# Patient Record
Sex: Male | Born: 1995
Health system: Southern US, Community
[De-identification: ages and names within clinical notes are randomized; demographics above are authoritative.]

## PROBLEM LIST (undated history)

## (undated) DIAGNOSIS — Z8719 Personal history of other diseases of the digestive system: Secondary | ICD-10-CM

## (undated) DIAGNOSIS — Z9889 Other specified postprocedural states: Secondary | ICD-10-CM

## (undated) DIAGNOSIS — U071 COVID-19: Secondary | ICD-10-CM

## (undated) DIAGNOSIS — J209 Acute bronchitis, unspecified: Secondary | ICD-10-CM

## (undated) DIAGNOSIS — R21 Rash and other nonspecific skin eruption: Secondary | ICD-10-CM

## (undated) DIAGNOSIS — L97909 Non-pressure chronic ulcer of unspecified part of unspecified lower leg with unspecified severity: Secondary | ICD-10-CM

## (undated) DIAGNOSIS — R6521 Severe sepsis with septic shock: Secondary | ICD-10-CM

## (undated) DIAGNOSIS — Z87442 Personal history of urinary calculi: Secondary | ICD-10-CM

## (undated) DIAGNOSIS — Z931 Gastrostomy status: Secondary | ICD-10-CM

## (undated) DIAGNOSIS — I872 Venous insufficiency (chronic) (peripheral): Secondary | ICD-10-CM

## (undated) DIAGNOSIS — Z8659 Personal history of other mental and behavioral disorders: Secondary | ICD-10-CM

## (undated) DIAGNOSIS — Z8674 Personal history of sudden cardiac arrest: Secondary | ICD-10-CM

## (undated) DIAGNOSIS — A409 Streptococcal sepsis, unspecified: Secondary | ICD-10-CM

## (undated) DIAGNOSIS — Z8709 Personal history of other diseases of the respiratory system: Secondary | ICD-10-CM

## (undated) HISTORY — DX: Rash and other nonspecific skin eruption: R21

## (undated) HISTORY — DX: Venous insufficiency (chronic) (peripheral): I87.2

## (undated) HISTORY — DX: Acute bronchitis, unspecified: J20.9

## (undated) HISTORY — DX: Non-pressure chronic ulcer of unspecified part of unspecified lower leg with unspecified severity: L97.909

## (undated) HISTORY — PX: LATERAL RECTUS RESECTION: SHX2724

## (undated) HISTORY — DX: Severe sepsis with septic shock: R65.21

## (undated) HISTORY — DX: Streptococcal sepsis, unspecified: A40.9

## (undated) HISTORY — DX: Personal history of other mental and behavioral disorders: Z86.59

## (undated) HISTORY — DX: Personal history of other diseases of the digestive system: Z87.19

## (undated) HISTORY — DX: Personal history of sudden cardiac arrest: Z86.74

---
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
## 2014-06-26 ENCOUNTER — Telehealth: Payer: Self-pay | Admitting: Family Medicine

## 2014-06-26 NOTE — Telephone Encounter (Signed)
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

## 2014-06-26 NOTE — Telephone Encounter (Signed)
Please give a prescription with what the mom requests an order for the patient to get what he needs , I will sign

## 2014-06-26 NOTE — Telephone Encounter (Signed)
Script ready for pickup. Mother notified.  

## 2016-01-20 ENCOUNTER — Encounter (HOSPITAL_COMMUNITY): Payer: Self-pay | Admitting: Emergency Medicine

## 2016-01-20 ENCOUNTER — Emergency Department (HOSPITAL_COMMUNITY): Payer: BLUE CROSS/BLUE SHIELD

## 2016-01-20 ENCOUNTER — Emergency Department (HOSPITAL_COMMUNITY)
Admission: EM | Admit: 2016-01-20 | Discharge: 2016-01-20 | Disposition: A | Discharge status: Home / Self Care | Payer: BLUE CROSS/BLUE SHIELD | Attending: Emergency Medicine | Admitting: Emergency Medicine

## 2016-01-20 DIAGNOSIS — Z89511 Acquired absence of right leg below knee: Secondary | ICD-10-CM | POA: Diagnosis not present

## 2016-01-20 DIAGNOSIS — N200 Calculus of kidney: Secondary | ICD-10-CM | POA: Diagnosis not present

## 2016-01-20 DIAGNOSIS — Z8659 Personal history of other mental and behavioral disorders: Secondary | ICD-10-CM | POA: Diagnosis not present

## 2016-01-20 DIAGNOSIS — Z8719 Personal history of other diseases of the digestive system: Secondary | ICD-10-CM | POA: Diagnosis not present

## 2016-01-20 DIAGNOSIS — Z79899 Other long term (current) drug therapy: Secondary | ICD-10-CM | POA: Insufficient documentation

## 2016-01-20 DIAGNOSIS — Z8674 Personal history of sudden cardiac arrest: Secondary | ICD-10-CM | POA: Diagnosis not present

## 2016-01-20 DIAGNOSIS — Z8709 Personal history of other diseases of the respiratory system: Secondary | ICD-10-CM | POA: Insufficient documentation

## 2016-01-20 DIAGNOSIS — Z872 Personal history of diseases of the skin and subcutaneous tissue: Secondary | ICD-10-CM | POA: Diagnosis not present

## 2016-01-20 DIAGNOSIS — R109 Unspecified abdominal pain: Secondary | ICD-10-CM | POA: Diagnosis present

## 2016-01-20 DIAGNOSIS — Z8619 Personal history of other infectious and parasitic diseases: Secondary | ICD-10-CM | POA: Diagnosis not present

## 2016-01-20 DIAGNOSIS — Z792 Long term (current) use of antibiotics: Secondary | ICD-10-CM | POA: Diagnosis not present

## 2016-01-20 LAB — URINALYSIS, ROUTINE W REFLEX MICROSCOPIC
BILIRUBIN URINE: NEGATIVE
Glucose, UA: NEGATIVE mg/dL
Ketones, ur: NEGATIVE mg/dL
LEUKOCYTES UA: NEGATIVE
NITRITE: NEGATIVE
Protein, ur: NEGATIVE mg/dL
SPECIFIC GRAVITY, URINE: 1.024 (ref 1.005–1.030)
pH: 5.5 (ref 5.0–8.0)

## 2016-01-20 LAB — URINE MICROSCOPIC-ADD ON

## 2016-01-20 MED ORDER — OXYCODONE-ACETAMINOPHEN 5-325 MG PO TABS: 1.0000 | ORAL_TABLET | ORAL | Status: DC | PRN

## 2016-01-20 MED ORDER — ONDANSETRON HCL 4 MG PO TABS: 4.0000 mg | ORAL_TABLET | Freq: Four times a day (QID) | ORAL | Status: DC

## 2016-01-20 NOTE — ED Notes (Signed)
Patient with right flank/back pain.  Patient denies any pain during urination.  Patient awoke from sleep with the pain.  Patient was given two 5/325mg  of Oxycodone at 5am for the pain by mother.  Patient denies any vomiting, buy is nauseated.

## 2016-01-20 NOTE — Discharge Instructions (Signed)
Flank Pain °Flank pain refers to pain that is located on the side of the body between the upper abdomen and the back. The pain may occur over a short period of time (acute) or may be long-term or reoccurring (chronic). It may be mild or severe. Flank pain can be caused by many things. °CAUSES  °Some of the more common causes of flank pain include: °· Muscle strains.   °· Muscle spasms.   °· A disease of your spine (vertebral disk disease).   °· A lung infection (pneumonia).   °· Fluid around your lungs (pulmonary edema).   °· A kidney infection.   °· Kidney stones.   °· A very painful skin rash caused by the chickenpox virus (shingles).   °· Gallbladder disease.   °HOME CARE INSTRUCTIONS  °Home care will depend on the cause of your pain. In general, °· Rest as directed by your caregiver. °· Drink enough fluids to keep your urine clear or pale yellow. °· Only take over-the-counter or prescription medicines as directed by your caregiver. Some medicines may help relieve the pain. °· Tell your caregiver about any changes in your pain. °· Follow up with your caregiver as directed. °SEEK IMMEDIATE MEDICAL CARE IF:  °· Your pain is not controlled with medicine.   °· You have new or worsening symptoms. °· Your pain increases.   °· You have abdominal pain.   °· You have shortness of breath.   °· You have persistent nausea or vomiting.   °· You have swelling in your abdomen.   °· You feel faint or pass out.   °· You have blood in your urine. °· You have a fever or persistent symptoms for more than 2-3 days. °· You have a fever and your symptoms suddenly get worse. °MAKE SURE YOU:  °· Understand these instructions. °· Will watch your condition. °· Will get help right away if you are not doing well or get worse. °  °This information is not intended to replace advice given to you by your health care provider. Make sure you discuss any questions you have with your health care provider. °  °Document Released: 01/20/2006 Document  Revised: 08/23/2012 Document Reviewed: 07/13/2012 °Elsevier Interactive Patient Education ©2016 [employer] ° °Kidney Stones °Kidney stones (urolithiasis) are solid masses that form inside your kidneys. The intense pain is caused by the stone moving through the kidney, ureter, bladder, and urethra (urinary tract). When the stone moves, the ureter starts to spasm around the stone. The stone is usually passed in your pee (urine).  °HOME CARE °· Drink enough fluids to keep your pee clear or pale yellow. This helps to get the stone out. °· Take a 24-hour pee (urine) sample as told by your doctor. You may need to take another sample every 6-12 months. °· Strain all pee through the provided strainer. Do not pee without peeing through the strainer, not even once. If you pee the stone out, catch it in the strainer. The stone may be as small as a grain of salt. Take this to your doctor. This will help your doctor figure out what you can do to try to prevent more kidney stones. °· Only take medicine as told by your doctor. °· Make changes to your daily diet as told by your doctor. You may be told to: °¨ Limit how much salt you eat. °¨ Eat 5 or more servings of fruits and vegetables each day. °¨ Limit how much meat, poultry, fish, and eggs you eat. °· Keep all follow-up visits as told by your doctor. This is important. °·   Get follow-up X-rays as told by your doctor. °GET HELP IF: °You have pain that gets worse even if you have been taking pain medicine. °GET HELP RIGHT AWAY IF:  °· Your pain does not get better with medicine. °· You have a fever or shaking chills. °· Your pain increases and gets worse over 18 hours. °· You have new belly (abdominal) pain. °· You feel faint or pass out. °· You are unable to pee. °  °This information is not intended to replace advice given to you by your health care provider. Make sure you discuss any questions you have with your health care provider. °  °Document Released: 05/17/2008 Document  Revised: 08/20/2015 Document Reviewed: 05/02/2013 °Elsevier Interactive Patient Education ©2016 Elsevier Inc. ° °

## 2016-01-21 NOTE — ED Provider Notes (Signed)
CSN: [id number]     Arrival date & time 01/20/16  1191 History   First MD Initiated Contact with Patient 01/20/16 [phone]     Chief Complaint  Patient presents with  . Back Pain  . Flank Pain     (Consider location/radiation/quality/duration/timing/severity/associated sxs/prior Treatment) HPI   20 year old male with right flank/right back pain. Acute onset of very early this morning. We'll continue to sleep. Went to bed in his usual state of health. Describes the pain as sharp. Could not find a comfortable position. Associated with nausea. No vomiting. No urinary complaints. No fevers or chills. Patient's mother gave him 2 oxycodone with only improvement. No history of similar symptoms.   Past Medical History  Diagnosis Date  . Acute bronchitis   . Ulcer of lower limb, unspecified   . Unspecified venous (peripheral) insufficiency   . Rash and other nonspecific skin eruption   . Septic shock due to streptococcal infection (HCC) 2007  . H/O cardiac arrest   . H/O gastroesophageal reflux (GERD)   . History of ADHD    Past Surgical History  Procedure Laterality Date  . Below knee leg amputation Left 2007  . Joint replacement Right 2010    Right hip    No family history on file. Social History  Substance Use Topics  . Smoking status: Never Smoker   . Smokeless tobacco: None  . Alcohol Use: No    Review of Systems    Allergies  Review of patient's allergies indicates no known allergies.  Home Medications   Prior to Admission medications   Medication Sig Start Date End Date Taking? Authorizing Provider  cefPROZIL (CEFZIL) 500 MG tablet Take 1 tablet (500 mg total) by mouth 2 (two) times daily. 12/07/13   Babs Sciara, MD  ondansetron (ZOFRAN) 4 MG tablet Take 1 tablet (4 mg total) by mouth every 6 (six) hours. 01/20/16   Raeford Razor, MD  oxyCODONE-acetaminophen (PERCOCET/ROXICET) 5-325 MG tablet Take 1-2 tablets by mouth every 4 (four) hours as needed for severe pain.  01/20/16   Raeford Razor, MD   BP 112/63 mmHg  Pulse 67  Temp(Src) 97.3 F (36.3 C) (Oral)  Resp 18  Ht  (1.778 m)  Wt 220 lb (99.791 kg)  BMI 31.57 kg/m2  SpO2 98% Physical Exam  Constitutional: He appears well-developed and well-nourished. No distress.  HENT:  Head: Normocephalic and atraumatic.  Eyes: Conjunctivae are normal. Right eye exhibits no discharge. Left eye exhibits no discharge.  Neck: Neck supple.  Cardiovascular: Normal rate, regular rhythm and normal heart sounds.  Exam reveals no gallop and no friction rub.   No murmur heard. Pulmonary/Chest: Effort normal and breath sounds normal. No respiratory distress.  Abdominal: Soft. He exhibits no distension. There is no tenderness.  Musculoskeletal: He exhibits no edema or tenderness.  L BKA  Neurological: He is alert.  Skin: Skin is warm and dry.  Psychiatric: He has a normal mood and affect. His behavior is normal. Thought content normal.  Nursing note and vitals reviewed.   ED Course  Procedures (including critical care time) Labs Review Labs Reviewed  URINALYSIS, ROUTINE W REFLEX MICROSCOPIC (NOT AT [facility]) - Abnormal; Notable for the following:    Hgb urine dipstick LARGE (*)    All other components within normal limits  URINE MICROSCOPIC-ADD ON - Abnormal; Notable for the following:    Squamous Epithelial / LPF 0-5 (*)    Bacteria, UA RARE (*)    Casts HYALINE CASTS (*)  All other components within normal limits    Imaging Review Ct Renal Stone Study  01/20/2016  CLINICAL DATA:  20 year old male with sudden onset severe epigastric and midline abdominal pain at 0230 hours with nausea. Flank pain. Initial encounter. EXAM: CT ABDOMEN AND PELVIS WITHOUT CONTRAST TECHNIQUE: Multidetector CT imaging of the abdomen and pelvis was performed following the standard protocol without IV contrast. COMPARISON:  KUB 02/06/2006. FINDINGS: Negative lung bases.  No pericardial or pleural effusion. Right hip arthroplasty,  otherwise negative visualized osseous structures. No pelvic free fluid. Decompressed rectum. Retained stool in the sigmoid colon which is mildly redundant. Retained stool throughout the more proximal colon. Normal appendix (tracking to the midline from the cecum, series 2, image 63). Negative terminal ileum. No dilated small bowel. Negative non contrast stomach, duodenum, liver, gallbladder, spleen, pancreas and adrenal glands. No abdominal free air or free fluid. No lymphadenopathy. Negative non contrast right kidney and ureter. 2 mm left lower pole renal calculus. Otherwise negative noncontrast left kidney. Negative left ureter visualize to the left pelvic sidewall. There is streak artifact in the pelvis related to the right hip arthroplasty. There is a 4 mm calculus at or adjacent to the posterior wall of the urinary bladder best seen on series 2, image 85. The urinary bladder is decompressed. IMPRESSION: 1. No hydronephrosis or hydroureter to suggest acute obstructive uropathy, but there is a 2 mm left lower pole renal calculus as well as a 4 mm calculus at or near the left bladder in the pelvis (but further detail obscured by streak artifact from right hip arthroplasty). If there has been recent left flank pain, then consider a recently passed left ureteral calculus. 2. Otherwise negative noncontrast CT Abdomen and Pelvis. Electronically Signed   By: Odessa Fleming M.D.   On: 01/20/2016 08:09   I have personally reviewed and evaluated these images and lab results as part of my medical decision-making.   EKG Interpretation None      MDM   Final diagnoses:  Flank pain  Kidney stones    20yM with R flank pain, now resolved. May have potentially passed a stone? One potentially in bladder although L sided and not definitive in exact location. Blood on UA. Describes symptoms seem consistent with ureteral colic. No other concerning findings on imaging. Abdominal exam benign. It has been determined that no  acute conditions requiring further emergency intervention are present at this time. The patient has been advised of the diagnosis and plan. I reviewed any labs and imaging including any potential incidental findings. We have discussed signs and symptoms that warrant return to the ED and they are listed in the discharge instructions.      Raeford Razor, MD 01/21/16 [phone]

## 2016-06-01 ENCOUNTER — Ambulatory Visit (INDEPENDENT_AMBULATORY_CARE_PROVIDER_SITE_OTHER): Payer: BLUE CROSS/BLUE SHIELD | Admitting: Family Medicine

## 2016-06-01 ENCOUNTER — Encounter: Payer: Self-pay | Admitting: Family Medicine

## 2016-06-01 VITALS — BP 122/76 | Temp 98.3°F | Ht 71.5 in | Wt 209.0 lb

## 2016-06-01 DIAGNOSIS — J310 Chronic rhinitis: Secondary | ICD-10-CM

## 2016-06-01 DIAGNOSIS — J329 Chronic sinusitis, unspecified: Secondary | ICD-10-CM | POA: Diagnosis not present

## 2016-06-01 MED ORDER — AMOXICILLIN-POT CLAVULANATE 875-125 MG PO TABS: 1.0000 | ORAL_TABLET | Freq: Two times a day (BID) | ORAL | Status: AC

## 2016-06-01 NOTE — Progress Notes (Signed)
   Subjective:    Patient ID: [name], male    DOB: 10/14/1996, 20 y.o.   MRN: [id number]  Cough This is a new problem. The current episode started 1 to 4 weeks ago. The cough is productive of sputum. Associated symptoms include a sore throat. Associated symptoms comments: Sinus Pressure . Treatments tried: Nyquil.   Sinus cough and cold and cong  sonme prod cough  Not partic prod  No smoke  Some fumes with   No headache  ; Patient states no other concerns this visit.  Review of Systems  HENT: Positive for sore throat.   Respiratory: Positive for cough.        Objective:   Physical Exam Alert, mild malaise. Hydration good Vitals stable. frontal/ maxillary tenderness evident positive nasal congestion. pharynx normal neck supple  lungs clear/no crackles or wheezes. heart regular in rhythm        Assessment & Plan:  Impression rhinosinusitis likely post viral, discussed with patient. plan antibiotics prescribed. Questions answered. Symptomatic care discussed. warning signs discussed. WSL

## 2016-12-13 DIAGNOSIS — Z87442 Personal history of urinary calculi: Secondary | ICD-10-CM

## 2016-12-13 HISTORY — DX: Personal history of urinary calculi: Z87.442

## 2017-06-03 ENCOUNTER — Ambulatory Visit (INDEPENDENT_AMBULATORY_CARE_PROVIDER_SITE_OTHER): Payer: BLUE CROSS/BLUE SHIELD | Admitting: Family Medicine

## 2017-06-03 ENCOUNTER — Encounter: Payer: Self-pay | Admitting: Family Medicine

## 2017-06-03 VITALS — BP 120/74 | Temp 98.0°F | Ht 71.5 in | Wt 219.5 lb

## 2017-06-03 DIAGNOSIS — J329 Chronic sinusitis, unspecified: Secondary | ICD-10-CM | POA: Diagnosis not present

## 2017-06-03 MED ORDER — CLARITHROMYCIN 500 MG PO TABS: 500.0000 mg | ORAL_TABLET | Freq: Two times a day (BID) | ORAL | 0 refills | Status: AC

## 2017-06-03 NOTE — Progress Notes (Signed)
   Subjective:    Patient ID: [name], male    DOB: 01/07/1996, 21 y.o.   MRN: [id number]  Cough  This is a new problem. The current episode started in the past 7 days. Associated symptoms include a fever, headaches, rhinorrhea, a sore throat and wheezing. He has tried OTC cough suppressant for the symptoms.   Was round brother 'pos cong nd fever  Hi as 102  Non smoker  Working at [employer]  Feels cong and possibly even wheezy      Patient states no other concerns this visit.   Review of Systems  Constitutional: Positive for fever.  HENT: Positive for rhinorrhea and sore throat.   Respiratory: Positive for cough and wheezing.   Neurological: Positive for headaches.       Objective:   Physical Exam Alert, mild malaise. Hydration good Vitals stable. frontal/ maxillary tenderness evident positive nasal congestion. pharynx normal neck supple  lungs clear/no crackles or wheezes. heart regular in rhythm        Assessment & Plan:  Impression rhinosinusitis And element of bronchitis likely post viral, discussed with patient. plan antibiotics prescribed. Questions answered. Symptomatic care discussed. warning signs discussed. Preventative checkup in visit encouraged WSL

## 2017-10-24 IMAGING — CT CT RENAL STONE PROTOCOL
2 of 4 series · 15 of 46 positions shown, 17 images · non-contrast
Comparison: KUB 02/06/2006.

CLINICAL DATA: 20-year-old male with sudden onset severe epigastric
and midline abdominal pain at 3393 hours with nausea. Flank pain.
Initial encounter.

EXAM:
CT ABDOMEN AND PELVIS WITHOUT CONTRAST
TECHNIQUE: Multidetector CT imaging of the abdomen and pelvis was performed
following the standard protocol without IV contrast.

[Series 2: renal stone 5mm · axial · 0.76mm/px · z∈[+799,+1239]mm · 12 of 101 slices shown, 14 images]
[im 9/101  soft-tissue]
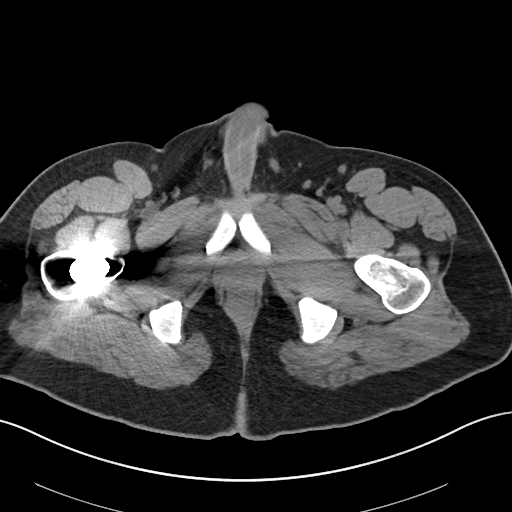
[im 9/101  bone]
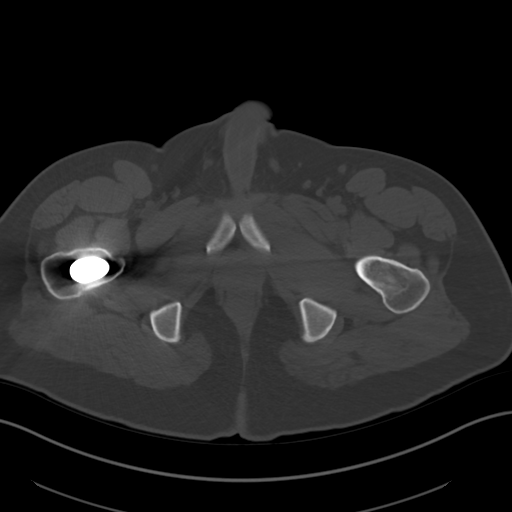
[im 17/101  soft-tissue]
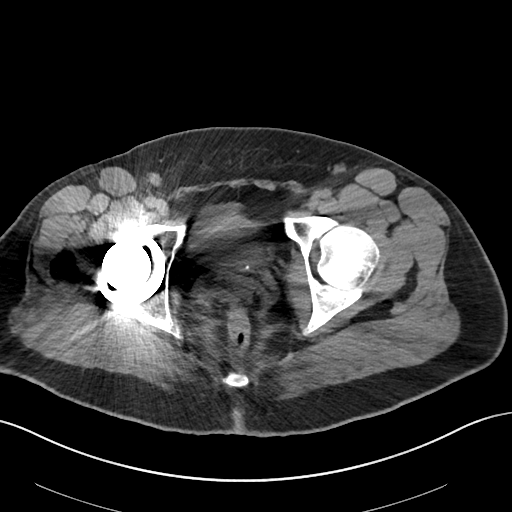
[im 25/101  soft-tissue]
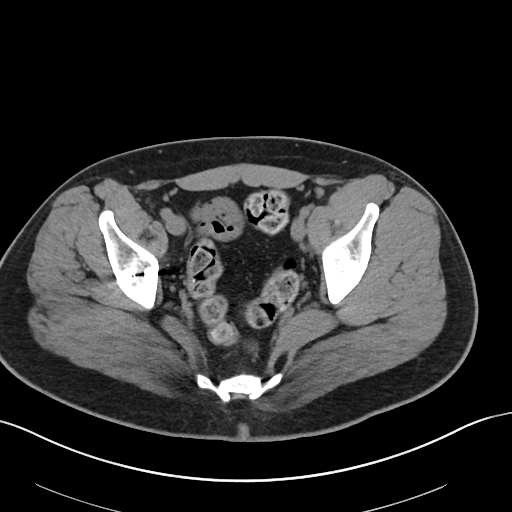
[im 33/101  soft-tissue]
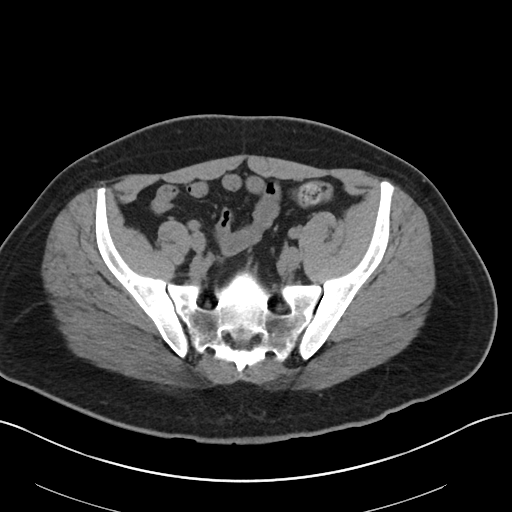
[im 41/101  soft-tissue]
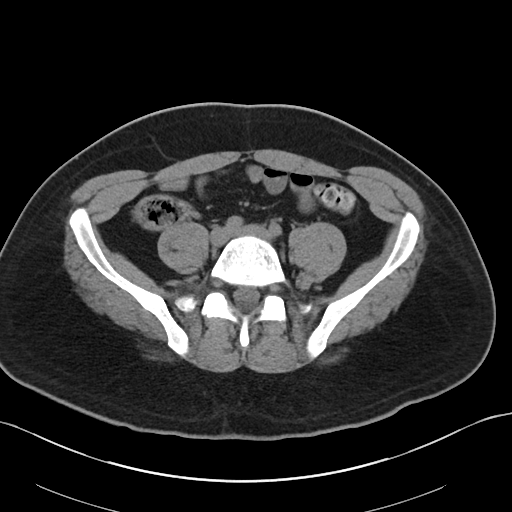
[im 49/101  soft-tissue]
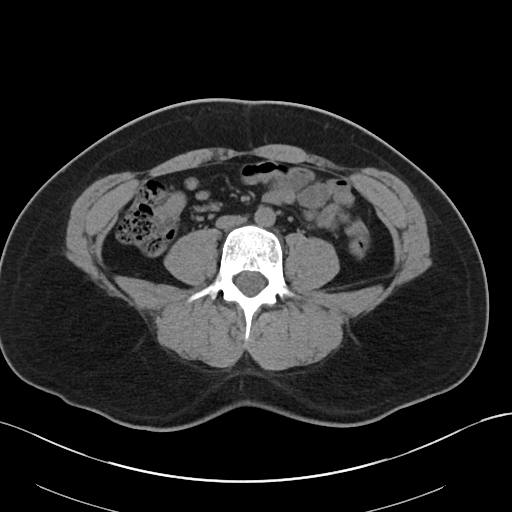
[im 57/101  soft-tissue]
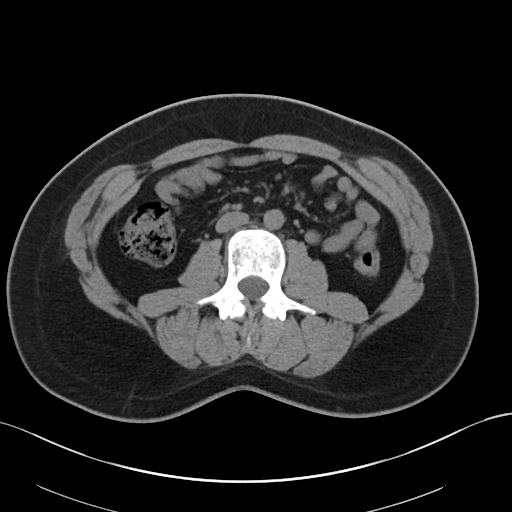
[im 65/101  soft-tissue]
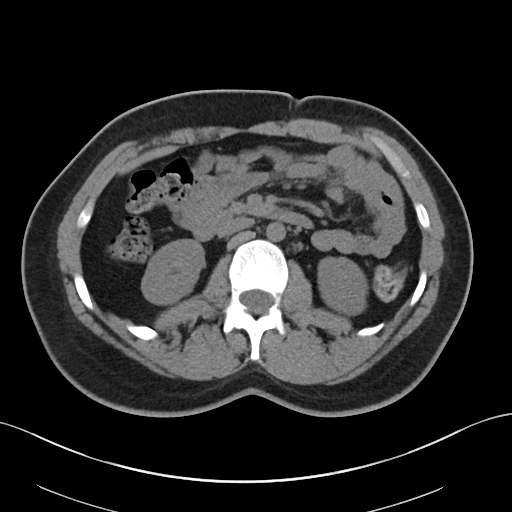
[im 73/101  soft-tissue]
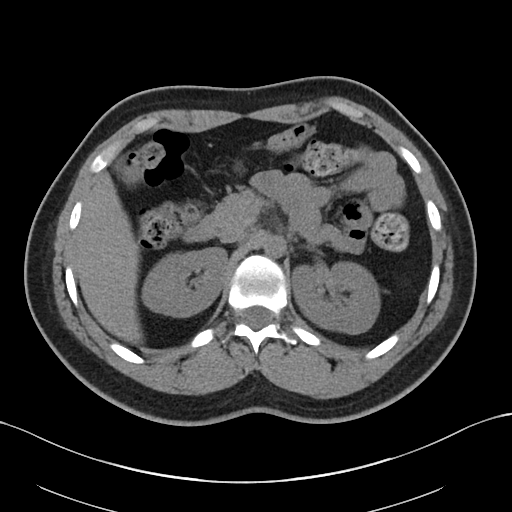
[im 73/101  bone]
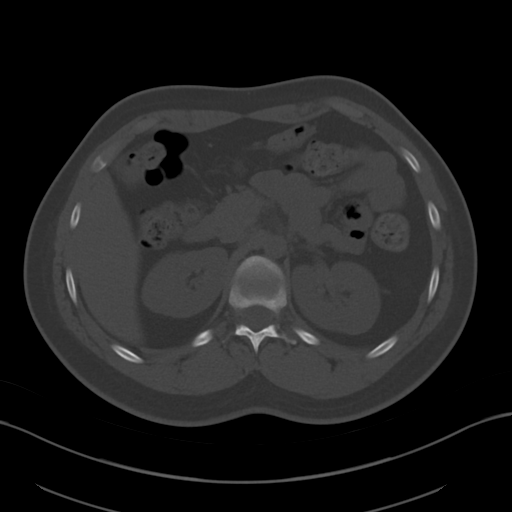
[im 81/101  soft-tissue]
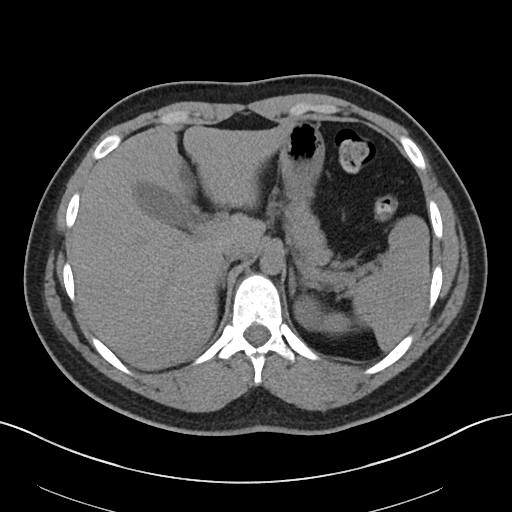
[im 89/101  soft-tissue]
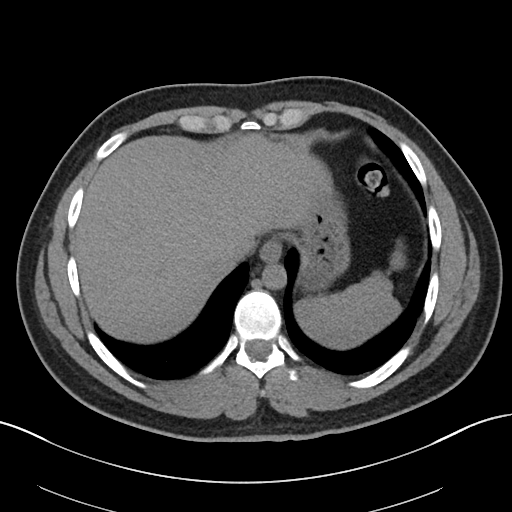
[im 97/101  soft-tissue]
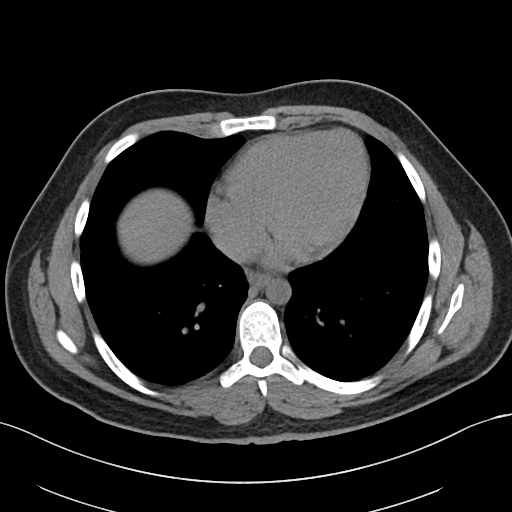

[Series 4: renal stone 3.0 cor · coronal · 0.59mm/px · 3 of 80 slices shown]
[im 27/80  soft-tissue]
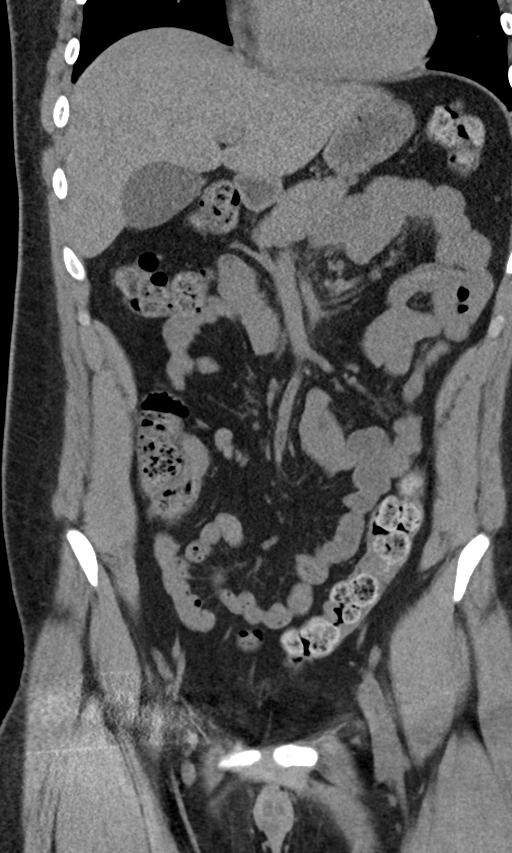
[im 36/80  soft-tissue]
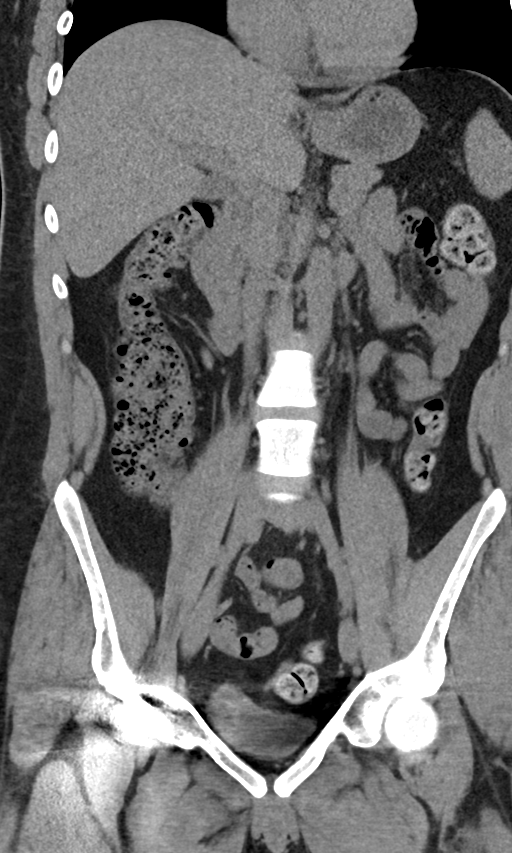
[im 44/80  soft-tissue]
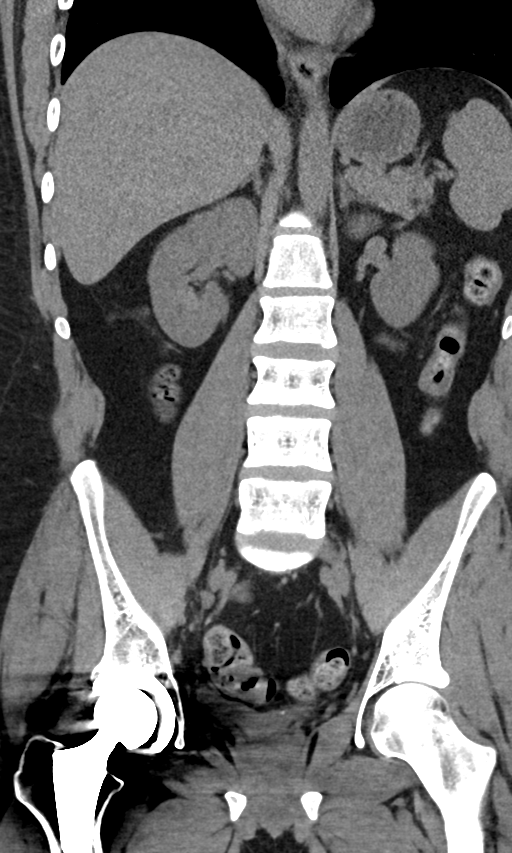

[15 of 46 positions shown; findings below may reference images not displayed]

FINDINGS: Negative lung bases.  No pericardial or pleural effusion.

Right hip arthroplasty, otherwise negative visualized osseous
structures.

No pelvic free fluid. Decompressed rectum. Retained stool in the
sigmoid colon which is mildly redundant. Retained stool throughout
the more proximal colon. Normal appendix (tracking to the midline
from the cecum, series 2, image 63). Negative terminal ileum. No
dilated small bowel.

Negative non contrast stomach, duodenum, liver, gallbladder, spleen,
pancreas and adrenal glands. No abdominal free air or free fluid. No
lymphadenopathy.

Negative non contrast right kidney and ureter. 2 mm left lower pole
renal calculus. Otherwise negative noncontrast left kidney. Negative
left ureter visualize to the left pelvic sidewall. There is streak
artifact in the pelvis related to the right hip arthroplasty. There
is a 4 mm calculus at or adjacent to the posterior wall of the
urinary bladder best seen on series 2, image 85. The urinary bladder
is decompressed.
IMPRESSION: 1. No hydronephrosis or hydroureter to suggest acute obstructive
uropathy, but there is a 2 mm left lower pole renal calculus as well
as a 4 mm calculus at or near the left bladder in the pelvis (but
further detail obscured by streak artifact from right hip
arthroplasty). If there has been recent left flank pain, then
consider a recently passed left ureteral calculus.
2. Otherwise negative noncontrast CT Abdomen and Pelvis.

## 2018-01-04 ENCOUNTER — Telehealth: Payer: Self-pay | Admitting: Family Medicine

## 2018-01-04 DIAGNOSIS — I8390 Asymptomatic varicose veins of unspecified lower extremity: Principal | ICD-10-CM

## 2018-01-04 DIAGNOSIS — I83899 Varicose veins of unspecified lower extremities with other complications: Secondary | ICD-10-CM

## 2018-01-04 NOTE — Telephone Encounter (Signed)
Mom called requesting a referral to [facility]   Pt has a vein on the back of his leg (left leg) that ruptures from time to time  Has ruptured and is bleeding  Would like this soon - please advise

## 2018-01-04 NOTE — Telephone Encounter (Signed)
Referral made I called and left a message to r/c.

## 2018-01-04 NOTE — Telephone Encounter (Signed)
Let's do 

## 2018-01-05 NOTE — Telephone Encounter (Signed)
Patient is not excepting calls from blocked number.

## 2018-01-05 NOTE — Telephone Encounter (Signed)
Mother notified referral was put in.

## 2018-01-09 ENCOUNTER — Ambulatory Visit (INDEPENDENT_AMBULATORY_CARE_PROVIDER_SITE_OTHER): Payer: BLUE CROSS/BLUE SHIELD | Admitting: Vascular Surgery

## 2018-01-09 ENCOUNTER — Encounter: Payer: Self-pay | Admitting: Vascular Surgery

## 2018-01-09 VITALS — BP 124/75 | HR 65 | Temp 98.0°F | Resp 20 | Ht 71.5 in | Wt 222.1 lb

## 2018-01-09 DIAGNOSIS — M79605 Pain in left leg: Secondary | ICD-10-CM | POA: Diagnosis not present

## 2018-01-09 NOTE — Progress Notes (Signed)
Subjective:     Patient ID: [name], male   DOB: Jul 22, 1996, 22 y.o.   MRN: [id number]  HPI This 22 year old male was referred by Dr. Lubertha South for evaluation of intermittent pain and drainage from the left leg. This patient had an amputation-below knee-of the left leg 23 years ago following a strep upper respiratory infection which apparently caused secondary infection in the left lower extremity and resulted in amputation. Patient has had some problems with his below-knee prosthesis with irritation of the skin but currently the amputation prosthesis is fitting better. He has had intermittent drainage which is described as bloody and sometimes with pus particularly from the medial aspect around the left knee area. Patient was seen today with his mother present and other family members on audio  listening and on a cell phone and also had questions. She was evaluated at [facility] and had a previous ultrasound and was told that there was nothing venous or vascular indicated as far as procedures. Patient is frustrated because of intermittent pain and drainage in this area. He has been evaluated by a surgeon at [facility] who said to return when the wound was draining so it could be better evaluated. Patient has no history of DVT or thrombophlebitis.  Past Medical History:  Diagnosis Date  . Acute bronchitis   . H/O cardiac arrest   . H/O gastroesophageal reflux (GERD)   . History of ADHD   . Rash and other nonspecific skin eruption   . Septic shock due to streptococcal infection (HCC) 2007  . Ulcer of lower limb, unspecified   . Unspecified venous (peripheral) insufficiency     Social History   Tobacco Use  . Smoking status: Never Smoker  . Smokeless tobacco: Former Engineer, water Use Topics  . Alcohol use: No    History reviewed. No pertinent family history.  No Known Allergies  No current outpatient medications on file.  Vitals:   01/09/18 0948  BP:  124/75  Pulse: 65  Resp: 20  Temp: 98 F (36.7 C)  TempSrc: Oral  SpO2: 98%  Weight: 222 lb 1.6 oz (100.7 kg)  Height: 5' 11.5" (1.816 m)    Body mass index is 30.54 kg/m.         Review of Systems See history of present illness. Patient is also had left hip replacement. Has history of septic shock with cardiac arrest during his original illness    Objective:   Physical Exam BP 124/75 (BP Location: Left Arm, Patient Position: Sitting, Cuff Size: Normal)   Pulse 65   Temp 98 F (36.7 C) (Oral)   Resp 20   Ht 5' 11.5" (1.816 m)   Wt 222 lb 1.6 oz (100.7 kg)   SpO2 98%   BMI 30.54 kg/m     Gen.-alert and oriented x3 in no apparent distress HEENT normal for age Lungs no rhonchi or wheezing Cardiovascular regular rhythm no murmurs carotid pulses 3+ palpable no bruits audible Abdomen soft nontender no palpable masses Musculoskeletal left below-knee amputation with well-healed stump. There is thickening medially and laterally at the knee level possibly due to previous infection in these areas. Patient states no surgery has been performed in these areas. No bulging varicosities are noted. Skin clear -no rashes Neurologic normal Lower extremities 3+ femoral and dorsalis pedis pulses palpable right side Left leg with above mentioned thickening medially and laterally and 1 area on the medial aspect near the knee which is mildly tender  but no fluctuance or drainage is present  Today I used the bedside SonoSite ultrasound and examined the left lower extremity. The great saphenous vein is normal with no evidence of reflux or enlargement down to the knee. No bulging varicosities are visualized in the area of question in the medial or lateral aspect of the stump up to the knee level.      Assessment:     History of pain and recurrent drainage from right medial knee area proximal to previous below-knee amputation which was performed 23 years ago. Previous evaluation at [facility] and no indication for vascular intervention was found No evidence of abnormality great saphenous vein on the left by bedside SonoSite ultrasound today or on previous ultrasound at [facility] in the past according to patient's mother    Plan:     Family is obviously frustrated because of this problem in which direction to turn I suggested that patient does need to be evaluated with active drainage is occurring and this will require going to the emergency department to get evaluated at that time. This is not a vascular surgery issue I told the patient since there is no evidence of any vascular intervention that needs to be performed Possibly in the future when this is evaluated while draining it will need open surgical exploration by general surgery in the operating room to see if this would help with long-term healing No further suggestions and all questions of the patient and family were answered

## 2018-01-26 ENCOUNTER — Ambulatory Visit: Payer: Self-pay | Admitting: Surgery

## 2018-01-27 ENCOUNTER — Encounter (HOSPITAL_BASED_OUTPATIENT_CLINIC_OR_DEPARTMENT_OTHER): Payer: Self-pay

## 2018-01-27 ENCOUNTER — Other Ambulatory Visit: Payer: Self-pay

## 2018-01-27 NOTE — Progress Notes (Signed)
Spoke with:  Timothy MausLucky (mother of Timothy PilgrimJacob) NPO:  After Midnight, no gum, candy, or mints   Arrival time:  0530AM Labs:  Hemoglobin AM medications:  Hibiclens shower, No medications Pre op orders: Yes Ride home:  Timothy MausLucky (mother) [phone]

## 2018-01-29 ENCOUNTER — Encounter (HOSPITAL_BASED_OUTPATIENT_CLINIC_OR_DEPARTMENT_OTHER): Payer: Self-pay | Admitting: Surgery

## 2018-01-29 DIAGNOSIS — S91009A Unspecified open wound, unspecified ankle, initial encounter: Secondary | ICD-10-CM

## 2018-01-29 DIAGNOSIS — S81009A Unspecified open wound, unspecified knee, initial encounter: Secondary | ICD-10-CM | POA: Diagnosis present

## 2018-01-29 DIAGNOSIS — S81809A Unspecified open wound, unspecified lower leg, initial encounter: Secondary | ICD-10-CM

## 2018-01-29 NOTE — H&P (Signed)
General Surgery [facility]  Timothy SpeakJacob E Morrison DOB: 06/26/1996 Single / Language: Len[place] P[place] / Race: White Male   History of Present Illness  The patient is a 22 year old male who presents with a complaint of Mass. He is s/p left extremity BKA due to strep bacteremia causing septic shock and affecting his extremities. [facility] Holl[facility]k [facility] performed his amputation surgery 12 years ago. He also underwent a left [facility], 8 years ago for avascular necrosis.   He is presenting to urgent office, with his mother, for evaluation of an area lateral and inferior to his left knee which builds up with blood every 3 or 4 weeks and spontaneously drains blood - this has occurred for the last few years. He denies seeing purulent material or redness to the skin. Denies pain unless he is pressing on the area. After applying pressure to the area, it will stop bleeding. Denies f/c.  The area has also been evaluated by vascular specialists and he has been told there are no issues with blood flow. Dr. Hart RochesterLawson, a vascular surgeon, evaluated him 3 weeks ago and referred him to general surgery for further evaluation. He is otherwise generally healthy. Does not have any known cardiac, respiratory, or renal issues. He does not take any prescription medications.   Past Surgical History Hip Surgery  Right.  Diagnostic Studies History Colonoscopy  never  Allergies No Known Drug Allergies Allergies Reconciled   Medication History  No Current Medications Medications Reconciled  Social History Caffeine use  Carbonated beverages, Coffee, Tea. No alcohol use  No drug use  Tobacco use  Never smoker.  Family History Heart disease in male family member before age 23  Melanoma  Mother.  Other Problems Other disease, cancer, significant illness     Review of Systems General Not Present- Appetite Loss, Chills, Fatigue, Fever, Night Sweats,  Weight Gain and Weight Loss. Skin Present- New Lesions. Not Present- Change in Wart/Mole, Dryness, Hives, Jaundice, Non-Healing Wounds, Rash and Ulcer. HEENT Not Present- Earache, Hearing Loss, Hoarseness, Nose Bleed, Oral Ulcers, Ringing in the Ears, Seasonal Allergies, Sinus Pain, Sore Throat, Visual Disturbances, Wears glasses/contact lenses and Yellow Eyes. Respiratory Present- Snoring. Not Present- Bloody sputum, Chronic Cough, Difficulty Breathing and Wheezing. Breast Not Present- Breast Mass, Breast Pain, Nipple Discharge and Skin Changes. Cardiovascular Not Present- Chest Pain, Difficulty Breathing Lying Down, Leg Cramps, Palpitations, Rapid Heart Rate, Shortness of Breath and Swelling of Extremities. Gastrointestinal Not Present- Abdominal Pain, Bloating, Bloody Stool, Change in Bowel Habits, Chronic diarrhea, Constipation, Difficulty Swallowing, Excessive gas, Gets full quickly at meals, Hemorrhoids, Indigestion, Nausea, Rectal Pain and Vomiting. Male Genitourinary Not Present- Blood in Urine, Change in Urinary Stream, Frequency, Impotence, Nocturia, Painful Urination, Urgency and Urine Leakage. Musculoskeletal Not Present- Back Pain, Joint Pain, Joint Stiffness, Muscle Pain, Muscle Weakness and Swelling of Extremities. Neurological Not Present- Decreased Memory, Fainting, Headaches, Numbness, Seizures, Tingling, Tremor, Trouble walking and Weakness. Psychiatric Not Present- Anxiety, Bipolar, Change in Sleep Pattern, Depression, Fearful and Frequent crying. Endocrine Not Present- Cold Intolerance, Excessive Hunger, Hair Changes, Heat Intolerance, Hot flashes and New Diabetes. Hematology Not Present- Blood Thinners, Easy Bruising, Excessive bleeding, Gland problems, HIV and Persistent Infections.  Vitals Weight: 221 lb Height: 70in Body Surface Area: 2.18 m Body Mass Index: 31.71 kg/m  Temp.: 98.50F  Pulse: 76 (Regular)  BP: 126/78 (Sitting, Left Arm, Standard)  Physical  Exam   GENERAL: Well-developed, well nourished male in no acute  distress  EYES: No scleral icterus Pupils equal, lids normal  EXTERNAL EARS: Intact, no masses or lesions EXTERNAL NOSE: Intact, no masses or lesions MOUTH: Lips - no lesions Dentition - normal for age  RESPIRATORY: Normal effort, no use of accessory muscles  SKIN: Warm and dry Not diaphoretic  PSYCHIATRIC: Normal judgement and insight Normal mood and affect Alert, oriented x 3  Integumentary Note: Small opening (0.5cm) located on his lateral stump, inferior to his left knee It is draining blood along with small clots when pressure is applied to the area There is a mass felt posterior to the opening about 1 inch in width There is a small pinpoint opening which also drains bloody material anterior and inferior to the opening There is no surrounding erythema, induration, or fluctuance. No purulence   Musculoskeletal Note: Left BKA with prosthetic     Assessment & Plan  MASS (R22.9)  Impression: He is a 22 year old male with PMHx of BKA due to strep bacteremia and septic shock. He has an area on his left lower extremity stump that periodically drains bloody material. He was referred to general surgery by vascular surgery. Dr. Gerrit Friends evaluated the patient with me and agreed to perform a wound exploration on the area to determine the etiology of this issue. He explained to the patient and his mother that the area may require open packing if is not able to be closed. They provided verbal understanding and agree with the plan. Surgery will be scheduled as outpatient. He will call with questions or concerns in the meantime.  Darnell Level, MD [facility] Office: [phone]

## 2018-01-30 NOTE — Progress Notes (Signed)
Arrival time ,surgical time change noted as chart reviewed. Message left on home phone [phone] to arrive at 1115  01/31/2018.

## 2018-01-31 ENCOUNTER — Ambulatory Visit (HOSPITAL_BASED_OUTPATIENT_CLINIC_OR_DEPARTMENT_OTHER): Payer: BLUE CROSS/BLUE SHIELD | Admitting: Certified Registered"

## 2018-01-31 ENCOUNTER — Encounter (HOSPITAL_BASED_OUTPATIENT_CLINIC_OR_DEPARTMENT_OTHER)
Admission: RE | Disposition: A | Discharge status: Home / Self Care | Payer: Self-pay | Source: Ambulatory Visit | Attending: Surgery

## 2018-01-31 ENCOUNTER — Ambulatory Visit (HOSPITAL_BASED_OUTPATIENT_CLINIC_OR_DEPARTMENT_OTHER)
Admission: RE | Admit: 2018-01-31 | Discharge: 2018-01-31 | Disposition: A | Discharge status: Home / Self Care | Payer: BLUE CROSS/BLUE SHIELD | Source: Ambulatory Visit | Attending: Surgery | Admitting: Surgery

## 2018-01-31 ENCOUNTER — Other Ambulatory Visit: Payer: Self-pay

## 2018-01-31 ENCOUNTER — Encounter (HOSPITAL_BASED_OUTPATIENT_CLINIC_OR_DEPARTMENT_OTHER): Payer: Self-pay | Admitting: Certified Registered"

## 2018-01-31 DIAGNOSIS — T8789 Other complications of amputation stump: Secondary | ICD-10-CM | POA: Diagnosis not present

## 2018-01-31 DIAGNOSIS — Z96642 Presence of left artificial hip joint: Secondary | ICD-10-CM | POA: Insufficient documentation

## 2018-01-31 DIAGNOSIS — Z8249 Family history of ischemic heart disease and other diseases of the circulatory system: Secondary | ICD-10-CM | POA: Diagnosis not present

## 2018-01-31 DIAGNOSIS — Z6831 Body mass index (BMI) 31.0-31.9, adult: Secondary | ICD-10-CM | POA: Insufficient documentation

## 2018-01-31 DIAGNOSIS — S81809A Unspecified open wound, unspecified lower leg, initial encounter: Secondary | ICD-10-CM

## 2018-01-31 DIAGNOSIS — I739 Peripheral vascular disease, unspecified: Secondary | ICD-10-CM | POA: Diagnosis not present

## 2018-01-31 DIAGNOSIS — S91009A Unspecified open wound, unspecified ankle, initial encounter: Secondary | ICD-10-CM

## 2018-01-31 DIAGNOSIS — E669 Obesity, unspecified: Secondary | ICD-10-CM | POA: Diagnosis not present

## 2018-01-31 DIAGNOSIS — Y835 Amputation of limb(s) as the cause of abnormal reaction of the patient, or of later complication, without mention of misadventure at the time of the procedure: Secondary | ICD-10-CM | POA: Insufficient documentation

## 2018-01-31 DIAGNOSIS — S81009A Unspecified open wound, unspecified knee, initial encounter: Secondary | ICD-10-CM | POA: Diagnosis present

## 2018-01-31 HISTORY — PX: WOUND EXPLORATION: SHX6188

## 2018-01-31 HISTORY — DX: Personal history of other diseases of the respiratory system: Z87.09

## 2018-01-31 HISTORY — DX: Gastrostomy status: Z93.1

## 2018-01-31 HISTORY — DX: Personal history of urinary calculi: Z87.442

## 2018-01-31 HISTORY — DX: Other specified postprocedural states: Z98.890

## 2018-01-31 LAB — POCT HEMOGLOBIN-HEMACUE: HEMOGLOBIN: 14.7 g/dL (ref 13.0–17.0)

## 2018-01-31 SURGERY — WOUND EXPLORATION
Anesthesia: General | Laterality: Left

## 2018-01-31 MED ORDER — FENTANYL CITRATE (PF) 100 MCG/2ML IJ SOLN
25.0000 ug | INTRAMUSCULAR | Status: DC | PRN
  Filled 2018-01-31: qty 1

## 2018-01-31 MED ORDER — DEXAMETHASONE SODIUM PHOSPHATE 10 MG/ML IJ SOLN
INTRAMUSCULAR | Status: AC
  Filled 2018-01-31: qty 1

## 2018-01-31 MED ORDER — HYDROCODONE-ACETAMINOPHEN 7.5-325 MG PO TABS
1.0000 | ORAL_TABLET | Freq: Once | ORAL | Status: DC | PRN
  Filled 2018-01-31: qty 1

## 2018-01-31 MED ORDER — CHLORHEXIDINE GLUCONATE CLOTH 2 % EX PADS
6.0000 | MEDICATED_PAD | Freq: Once | CUTANEOUS | Status: DC
  Filled 2018-01-31: qty 6

## 2018-01-31 MED ORDER — FENTANYL CITRATE (PF) 100 MCG/2ML IJ SOLN
INTRAMUSCULAR | Status: DC | PRN
  Administered 2018-01-31: 25 ug via INTRAVENOUS
  Administered 2018-01-31 (×2): 50 ug via INTRAVENOUS

## 2018-01-31 MED ORDER — FENTANYL CITRATE (PF) 100 MCG/2ML IJ SOLN
INTRAMUSCULAR | Status: AC
  Filled 2018-01-31: qty 2

## 2018-01-31 MED ORDER — BUPIVACAINE HCL 0.5 % IJ SOLN
INTRAMUSCULAR | Status: DC | PRN
  Administered 2018-01-31: 10 mL

## 2018-01-31 MED ORDER — PROMETHAZINE HCL 25 MG/ML IJ SOLN
6.2500 mg | INTRAMUSCULAR | Status: DC | PRN
  Filled 2018-01-31: qty 1

## 2018-01-31 MED ORDER — CEFAZOLIN SODIUM-DEXTROSE 2-4 GM/100ML-% IV SOLN
INTRAVENOUS | Status: AC
  Filled 2018-01-31: qty 100

## 2018-01-31 MED ORDER — PROPOFOL 10 MG/ML IV BOLUS
INTRAVENOUS | Status: DC | PRN
  Administered 2018-01-31: 200 mg via INTRAVENOUS

## 2018-01-31 MED ORDER — ONDANSETRON HCL 4 MG/2ML IJ SOLN
INTRAMUSCULAR | Status: DC | PRN
  Administered 2018-01-31: 4 mg via INTRAVENOUS

## 2018-01-31 MED ORDER — ONDANSETRON HCL 4 MG/2ML IJ SOLN
INTRAMUSCULAR | Status: AC
  Filled 2018-01-31: qty 2

## 2018-01-31 MED ORDER — CEFAZOLIN SODIUM-DEXTROSE 2-4 GM/100ML-% IV SOLN
2.0000 g | INTRAVENOUS | Status: DC
  Filled 2018-01-31: qty 100

## 2018-01-31 MED ORDER — HYDROCODONE-ACETAMINOPHEN 5-325 MG PO TABS: 1.0000 | ORAL_TABLET | Freq: Four times a day (QID) | ORAL | 0 refills | Status: DC | PRN

## 2018-01-31 MED ORDER — MIDAZOLAM HCL 2 MG/2ML IJ SOLN
INTRAMUSCULAR | Status: DC | PRN
  Administered 2018-01-31: 2 mg via INTRAVENOUS

## 2018-01-31 MED ORDER — MIDAZOLAM HCL 2 MG/2ML IJ SOLN
INTRAMUSCULAR | Status: AC
  Filled 2018-01-31: qty 2

## 2018-01-31 MED ORDER — LACTATED RINGERS IV SOLN
INTRAVENOUS | Status: DC
  Administered 2018-01-31: 12:00:00 via INTRAVENOUS
  Filled 2018-01-31: qty 1000

## 2018-01-31 MED ORDER — PROPOFOL 10 MG/ML IV BOLUS
INTRAVENOUS | Status: AC
  Filled 2018-01-31: qty 20

## 2018-01-31 MED ORDER — LIDOCAINE 2% (20 MG/ML) 5 ML SYRINGE
INTRAMUSCULAR | Status: AC
  Filled 2018-01-31: qty 5

## 2018-01-31 MED ORDER — LIDOCAINE 2% (20 MG/ML) 5 ML SYRINGE
INTRAMUSCULAR | Status: DC | PRN
  Administered 2018-01-31: 60 mg via INTRAVENOUS

## 2018-01-31 MED ORDER — MEPERIDINE HCL 25 MG/ML IJ SOLN
6.2500 mg | INTRAMUSCULAR | Status: DC | PRN
  Filled 2018-01-31: qty 1

## 2018-01-31 MED ORDER — DEXAMETHASONE SODIUM PHOSPHATE 10 MG/ML IJ SOLN
INTRAMUSCULAR | Status: DC | PRN
  Administered 2018-01-31: 10 mg via INTRAVENOUS

## 2018-01-31 SURGICAL SUPPLY — 51 items
APL SKNCLS STERI-STRIP NONHPOA (GAUZE/BANDAGES/DRESSINGS)
BANDAGE ELASTIC 4 VELCRO ST LF (GAUZE/BANDAGES/DRESSINGS) ×2 IMPLANT
BENZOIN TINCTURE PRP APPL 2/3 (GAUZE/BANDAGES/DRESSINGS) IMPLANT
BLADE SURG 15 STRL LF DISP TIS (BLADE) ×1 IMPLANT
BLADE SURG 15 STRL SS (BLADE) ×3
BNDG COHESIVE 4X5 TAN NS LF (GAUZE/BANDAGES/DRESSINGS) IMPLANT
BNDG GAUZE ELAST 4 BULKY (GAUZE/BANDAGES/DRESSINGS) ×2 IMPLANT
CHLORAPREP W/TINT 26ML (MISCELLANEOUS) ×3 IMPLANT
CLEANER CAUTERY TIP 5X5 PAD (MISCELLANEOUS) IMPLANT
CLOSURE WOUND 1/2 X4 (GAUZE/BANDAGES/DRESSINGS)
COVER BACK TABLE 60X90IN (DRAPES) ×3 IMPLANT
COVER MAYO STAND STRL (DRAPES) ×3 IMPLANT
DRAPE EXTREMITY T 121X128X90 (DRAPE) IMPLANT
DRAPE LAPAROTOMY 100X72 PEDS (DRAPES) IMPLANT
DRAPE LG THREE QUARTER DISP (DRAPES) IMPLANT
DRAPE ORTHO SPLIT 77X108 STRL (DRAPES)
DRAPE SURG ORHT 6 SPLT 77X108 (DRAPES) IMPLANT
DRAPE UTILITY XL STRL (DRAPES) ×3 IMPLANT
DRSG TEGADERM 2-3/8X2-3/4 SM (GAUZE/BANDAGES/DRESSINGS) IMPLANT
DRSG TEGADERM 4X4.75 (GAUZE/BANDAGES/DRESSINGS) IMPLANT
ELECT REM PT RETURN 9FT ADLT (ELECTROSURGICAL) ×3
ELECTRODE REM PT RTRN 9FT ADLT (ELECTROSURGICAL) ×1 IMPLANT
GAUZE SPONGE 4X4 12PLY STRL LF (GAUZE/BANDAGES/DRESSINGS) ×2 IMPLANT
GLOVE SURG ORTHO 8.0 STRL STRW (GLOVE) ×3 IMPLANT
GOWN STRL REUS W/ TWL LRG LVL3 (GOWN DISPOSABLE) ×1 IMPLANT
GOWN STRL REUS W/ TWL XL LVL3 (GOWN DISPOSABLE) ×1 IMPLANT
GOWN STRL REUS W/TWL LRG LVL3 (GOWN DISPOSABLE) ×3
GOWN STRL REUS W/TWL XL LVL3 (GOWN DISPOSABLE) ×3
KIT RM TURNOVER CYSTO AR (KITS) ×3 IMPLANT
MARKER SKIN DUAL TIP RULER LAB (MISCELLANEOUS) ×3 IMPLANT
NDL HYPO 25X1 1.5 SAFETY (NEEDLE) ×1 IMPLANT
NEEDLE HYPO 25X1 1.5 SAFETY (NEEDLE) ×3 IMPLANT
NS IRRIG 500ML POUR BTL (IV SOLUTION) ×3 IMPLANT
PACK BASIN DAY SURGERY FS (CUSTOM PROCEDURE TRAY) ×3 IMPLANT
PAD CLEANER CAUTERY TIP 5X5 (MISCELLANEOUS)
PENCIL BUTTON HOLSTER BLD 10FT (ELECTRODE) ×3 IMPLANT
SPONGE GAUZE 4X4 12PLY STER LF (GAUZE/BANDAGES/DRESSINGS) IMPLANT
STOCKINETTE 4X48 STRL (DRAPES) IMPLANT
STRIP CLOSURE SKIN 1/2X4 (GAUZE/BANDAGES/DRESSINGS) IMPLANT
SUT ETHILON 3 0 PS 1 (SUTURE) IMPLANT
SUT MNCRL AB 4-0 PS2 18 (SUTURE) IMPLANT
SUT VIC AB 3-0 SH 18 (SUTURE) IMPLANT
SUT VICRYL 3-0 CR8 SH (SUTURE) IMPLANT
SUT VICRYL 4-0 PS2 18IN ABS (SUTURE) IMPLANT
SYR CONTROL 10ML LL (SYRINGE) ×3 IMPLANT
TOWEL OR 17X24 6PK STRL BLUE (TOWEL DISPOSABLE) ×6 IMPLANT
TOWEL OR NON WOVEN STRL DISP B (DISPOSABLE) ×3 IMPLANT
TRAY DSU PREP LF (CUSTOM PROCEDURE TRAY) IMPLANT
TUBE CONNECTING 12'X1/4 (SUCTIONS)
TUBE CONNECTING 12X1/4 (SUCTIONS) IMPLANT
WATER STERILE IRR 500ML POUR (IV SOLUTION) IMPLANT

## 2018-01-31 NOTE — Discharge Instructions (Addendum)
°  Post Anesthesia Home Care Instructions  Activity: Get plenty of rest for the remainder of the day. A responsible individual must stay with you for 24 hours following the procedure.  For the next 24 hours, DO NOT: -Drive a car -Advertising copywriterperate machinery -Drink alcoholic beverages -Take any medication unless instructed by your physician -Make any legal decisions or sign important papers.  Meals: Start with liquid foods such as gelatin or soup. Progress to regular foods as tolerated. Avoid greasy, spicy, heavy foods. If nausea and/or vomiting occur, drink only clear liquids until the nausea and/or vomiting subsides. Call your physician if vomiting continues.  Special Instructions/Symptoms: Your throat may feel dry or sore from the anesthesia or the breathing tube placed in your throat during surgery. If this causes discomfort, gargle with warm salt water. The discomfort should disappear within 24 hours.  If you had a scopolamine patch placed behind your ear for the management of post- operative nausea and/or vomiting:  1. The medication in the patch is effective for 72 hours, after which it should be removed.  Wrap patch in a tissue and discard in the trash. Wash hands thoroughly with soap and water. 2. You may remove the patch earlier than 72 hours if you experience unpleasant side effects which may include dry mouth, dizziness or visual disturbances. 3. Avoid touching the patch. Wash your hands with soap and water after contact with the patch.    [facility] -- DISCHARGE INSTRUCTIONS  REMINDER:   Carry a list of your medications and allergies with you at all times  Call your pharmacy at least 1 week in advance to refill prescriptions  Do not mix any prescribed pain medicine with alcohol  Do not drive any motor vehicles while taking pain medication  Take medications with food unless otherwise directed  Follow-up appointments (date to return to physician): Please call  [phone] to confirm your follow up appointment with your surgeon.  Call your Surgeon if you have:  Temperature greater than 101.0  Persistent nausea and vomiting  Severe uncontrolled pain  Redness, tenderness, or signs of infection (pain, swelling, redness, odor or    green/yellow discharge around the site)  Difficulty breathing, headache or visual disturbances  Hives  Persistent dizziness or light-headedness  Any other questions or concerns you may have after discharge  In an emergency, call 911 or go to an Emergency Department at a nearby hospital.  Diet: Begin with liquids, and if they are tolerated, resume your usual diet.  Avoid spicy, greasy or heavy foods.  If you have nausea or vomiting, go back to liquids.  If you cannot keep liquids down, call your doctor.  Avoid alcohol consumption while on prescription pain medications. Good nutrition promotes healing. Increase fiber and fluids.   ADDITIONAL INSTRUCTIONS: Open wound left lower leg below knee - change dressing with NS moistened gauze 4x4 sponges packed into wound, wet to dry, twice daily.  May shower with wound open once daily.  May wear prosthesis if comfortable.  Velora Hecklerodd M. Gerkin, MD, [facility] Office: [phone]

## 2018-01-31 NOTE — Brief Op Note (Signed)
01/31/2018  1:29 PM  PATIENT:  [name]  22 y.o. male  PRE-OPERATIVE DIAGNOSIS:  chronic left calf  POST-OPERATIVE DIAGNOSIS:  chronic left calf  PROCEDURE:  Procedure(s): WOUND EXPLORATION DEBRIDEMENT WOUND OF LEFT CALF (Left)  SURGEON:  Surgeon(s) and Role:    Darnell Level* Meridith Romick, MD - Primary  ANESTHESIA:   general  EBL:  Minimal  BLOOD ADMINISTERED:none  DRAINS: none   LOCAL MEDICATIONS USED:  MARCAINE     SPECIMEN:  Excision  DISPOSITION OF SPECIMEN:  PATHOLOGY  COUNTS:  YES  TOURNIQUET:  * No tourniquets in log *  DICTATION: .Other Dictation: Dictation Number [id number]  PLAN OF CARE: Discharge to home after PACU  PATIENT DISPOSITION:  PACU - hemodynamically stable.   Delay start of Pharmacological VTE agent (>24hrs) due to surgical blood loss or risk of bleeding: yes  Darnell Levelodd Shaquanna Lycan, MD [facility] Office: [phone]

## 2018-01-31 NOTE — Anesthesia Postprocedure Evaluation (Signed)
Anesthesia Post Note  Patient: Veryl SpeakJacob E Barraclough  Procedure(s) Performed: WOUND EXPLORATION DEBRIDEMENT WOUND OF LEFT CALF (Left )     Patient location during evaluation: PACU Anesthesia Type: General Level of consciousness: awake and alert and oriented Pain management: pain level controlled Vital Signs Assessment: post-procedure vital signs reviewed and stable Respiratory status: spontaneous breathing, nonlabored ventilation and respiratory function stable Cardiovascular status: blood pressure returned to baseline and stable Postop Assessment: no apparent nausea or vomiting Anesthetic complications: no    Last Vitals:  Vitals:   01/31/18 1114 01/31/18 1333  BP: 133/74 (!) 108/54  Pulse: 63 76  Resp: 18   Temp: (!) 36.4 C (!) 36.3 C  SpO2: 98% 100%    Last Pain:  Vitals:   01/31/18 1114  TempSrc: Oral                 Liem Copenhaver A.

## 2018-01-31 NOTE — Transfer of Care (Signed)
Immediate Anesthesia Transfer of Care Note  Patient: [name]  Procedure(s) Performed: Procedure(s) (LRB): WOUND EXPLORATION DEBRIDEMENT WOUND OF LEFT CALF (Left)  Patient Location: PACU  Anesthesia Type: General  Level of Consciousness: awake, oriented, sedated and patient cooperative  Airway & Oxygen Therapy: Patient Spontanous Breathing and Patient connected to face mask oxygen  Post-op Assessment: Report given to PACU RN and Post -op Vital signs reviewed and stable  Post vital signs: Reviewed and stable  Complications: No apparent anesthesia complications Last Vitals:  Vitals:   01/31/18 1114 01/31/18 1333  BP: 133/74 (!) (P) 108/54  Pulse: 63 (P) 76  Resp: 18   Temp: (!) 36.4 C (!) (P) 36.3 C  SpO2: 98% (P) 100%    Last Pain:  Vitals:   01/31/18 1114  TempSrc: Oral

## 2018-01-31 NOTE — Anesthesia Preprocedure Evaluation (Addendum)
Anesthesia Evaluation  Patient identified by MRN, date of birth, ID band Patient awake    Reviewed: Allergy & Precautions, NPO status , Patient's Chart, lab work & pertinent test results  Airway Mallampati: III  TM Distance: >3 FB Neck ROM: Full    Dental no notable dental hx. (+) Teeth Intact   Pulmonary neg pulmonary ROS,  Hx/o tracheostomy   Pulmonary exam normal breath sounds clear to auscultation       Cardiovascular + Peripheral Vascular Disease  negative cardio ROS Normal cardiovascular exam Rhythm:Regular Rate:Normal     Neuro/Psych PSYCHIATRIC DISORDERS ADHDnegative neurological ROS     GI/Hepatic negative GI ROS, Neg liver ROS, Hx/o G-tube    Endo/Other  negative endocrine ROS  Renal/GU Hx/o renal calculi  negative genitourinary   Musculoskeletal Chronic wound left lower extremity S/P L BKA due to streptococcal bacteremia age 22   Abdominal (+) + obese,   Peds negative pediatric ROS (+)  Hematology negative hematology ROS (+)   Anesthesia Other Findings   Reproductive/Obstetrics negative OB ROS                            Anesthesia Physical Anesthesia Plan  ASA: II  Anesthesia Plan: General   Post-op Pain Management:    Induction: Intravenous  PONV Risk Score and Plan: 4 or greater and Treatment may vary due to age or medical condition, Midazolam, Propofol infusion, Dexamethasone and Ondansetron  Airway Management Planned: LMA  Additional Equipment:   Intra-op Plan:   Post-operative Plan: Extubation in OR  Informed Consent: I have reviewed the patients History and Physical, chart, labs and discussed the procedure including the risks, benefits and alternatives for the proposed anesthesia with the patient or authorized representative who has indicated his/her understanding and acceptance.   Dental advisory given  Plan Discussed with: CRNA, Anesthesiologist and  Surgeon  Anesthesia Plan Comments:       Anesthesia Quick Evaluation

## 2018-01-31 NOTE — Interval H&P Note (Signed)
History and Physical Interval Note:  01/31/2018 1:27 PM  Veryl SpeakJacob E Fazzini  has presented today for surgery, with the diagnosis of chronic left calf.  The various methods of treatment have been discussed with the patient and family. After consideration of risks, benefits and other options for treatment, the patient has consented to    Procedure(s): WOUND EXPLORATION DEBRIDEMENT WOUND OF LEFT CALF (Left) as a surgical intervention .    The patient's history has been reviewed, patient examined, no change in status, stable for surgery.  I have reviewed the patient's chart and labs.  Questions were answered to the patient's satisfaction.    Darnell Levelodd Delyla Sandeen, MD [facility] Office: [phone]    Nieves Chapa MJudie Petit

## 2018-01-31 NOTE — Anesthesia Procedure Notes (Signed)
Procedure Name: LMA Insertion Date/Time: 01/31/2018 12:46 PM Performed by: Francie MassingHazel, Raymir Frommelt D, CRNA Pre-anesthesia Checklist: Patient identified, Emergency Drugs available, Suction available and Patient being monitored Patient Re-evaluated:Patient Re-evaluated prior to induction Oxygen Delivery Method: Circle system utilized Preoxygenation: Pre-oxygenation with 100% oxygen Induction Type: IV induction Ventilation: Mask ventilation without difficulty LMA: LMA inserted LMA Size: 4.0 Number of attempts: 1 Airway Equipment and Method: Bite block Placement Confirmation: positive ETCO2 Tube secured with: Tape Dental Injury: Teeth and Oropharynx as per pre-operative assessment

## 2018-02-01 ENCOUNTER — Encounter (HOSPITAL_BASED_OUTPATIENT_CLINIC_OR_DEPARTMENT_OTHER): Payer: Self-pay | Admitting: Surgery

## 2018-02-01 NOTE — Op Note (Signed)
NAME:  [name], [name]                   ACCOUNT NO.:  MEDICAL RECORD NO.:  [id number]  LOCATION:                                 FACILITY:  PHYSICIAN:  Velora Heckler, MD           DATE OF BIRTH:  DATE OF PROCEDURE:  01/31/2018                              OPERATIVE REPORT   PREOPERATIVE DIAGNOSIS:  Chronic wound, left lower extremity with drainage.  POSTOPERATIVE DIAGNOSIS:  Chronic wound, left lower extremity with drainage.  PROCEDURE:  Wound exploration with debridement of skin and subcutaneous tissue (5 x 4 x 1 cm).  SURGEON:  Velora Heckler, MD  ANESTHESIA:  General.  ESTIMATED BLOOD LOSS:  Minimal.  PREPARATION:  ChloraPrep.  COMPLICATIONS:  None.  INDICATIONS:  The patient has a chronic wound on the lateral aspect of his below-knee amputation stump, which intermittently drains a dark, bloody and sometimes purulent fluid.  The patient has been evaluated by Wound Care and by Vascular Surgery.  The patient had undergone amputation of the left lower limb at [facility] 12 years ago due to bacteremia and septic shock.  The patient now comes to Surgery for wound exploration and debridement for chronic wound of the left lower extremity below the knee.  BODY OF REPORT:  Procedure was done in OR #3 at the [facility].  The patient was brought to the operating room, placed in a supine position on the operating room table.  Following administration of general anesthesia, the patient was rolled slightly to the right and supported with pillows.  The left lower extremity was prepped and draped in the usual aseptic fashion.  After ascertaining that an adequate level of anesthesia had been achieved, the area in question was explored with a hemostat.  The fistulous opening to the skin was easily reopened with the tip of a hemostat.  There was dark bloody drainage.  This does not appear purulent.  Wound was explored with the hemostat and it extends for few  centimeters medially and laterally.  Overlying skin was opened with the electrocautery.  There was indurated, firm, dark tissue in the deep dermis and subcutaneous tissues.  This does not appear to be purulent.  It does not appear to be granulation tissue.  Tissue was debrided using the electrocautery for hemostasis.  Tissue was debrided back to what appears to be healthy adipose tissue and healthy dermis. The entire area debrided, measured 5 x 4 x 1 cm in size.  Good hemostasis was achieved.  Debrided tissue was submitted to Pathology for review.  The wound was dressed with saline moistened 4 x 4 gauze sponge packings followed by dry gauze, followed by Kerlix, followed by Ace wrap.  The patient was awakened from anesthesia and brought to the recovery room.  The patient tolerated the procedure well.   Darnell Level, MD [facility] Office: [phone]    TMG/MEDQ  D:  01/31/2018  T:  01/31/2018  Job:  [id number]  cc:   Donna Bernard, M.D. Fax: [phone]  Velora Heckler, MD [phone] [address] [place] [address]  Quita Skye. Hart Rochester, M.D. [address]  [address] [place] [id number]

## 2018-04-20 ENCOUNTER — Telehealth (INDEPENDENT_AMBULATORY_CARE_PROVIDER_SITE_OTHER): Payer: Self-pay | Admitting: Radiology

## 2018-04-20 NOTE — Telephone Encounter (Signed)
Triage phone call from Dr Gerrit Friends, he spoke with Dr Lajoyce Corners.  Patient needs appt with Dr Lajoyce Corners next week, and I called patient and LM on cell to call me to schedule this.  Possibly Tuesday am if patient is available.

## 2018-04-20 NOTE — Telephone Encounter (Signed)
Mom called back, appt made for Tuesday 04/25/18 at 815 am

## 2018-04-25 ENCOUNTER — Encounter (INDEPENDENT_AMBULATORY_CARE_PROVIDER_SITE_OTHER): Payer: Self-pay | Admitting: Orthopedic Surgery

## 2018-04-25 ENCOUNTER — Ambulatory Visit (INDEPENDENT_AMBULATORY_CARE_PROVIDER_SITE_OTHER): Payer: 59 | Admitting: Orthopedic Surgery

## 2018-04-25 VITALS — Ht 71.0 in | Wt 215.0 lb

## 2018-04-25 DIAGNOSIS — L97221 Non-pressure chronic ulcer of left calf limited to breakdown of skin: Secondary | ICD-10-CM | POA: Diagnosis not present

## 2018-04-25 NOTE — Progress Notes (Signed)
Office Visit Note   Patient: [name]           Date of Birth: [birth year]           MRN: [id number] Visit Date: 04/25/2018              Requested by: Merlyn Albert, MD [address] [place], [place] [address] PCP: Merlyn Albert, MD  Chief Complaint  Patient presents with  . Left Leg - Pain, Open Wound      HPI: Patient is a 22 year old gentleman with a left transtibial amputation.  Patient has had recent episodes of ulcers in the popliteal fossa.  He has had modifications to the socket he has had wound debridement with Dr. Gerrit Friends he has had vein bleeding posteriorly medially.  Patient is seen for evaluation and treatment of the popliteal ulcers.  Assessment & Plan: Visit Diagnoses:  1. Calf ulcer, left, limited to breakdown of skin (HCC)     Plan: Recommended wearing the medical compression stocking when he is not wearing his leg, patient was given a 2 extra-large shrinker, he states he is wearing his leg at least 10 hours a day.  Discussed wound care and will reevaluate in 3 weeks.  I also discussed the socket with [name] and they will follow up with [facility] for socket modifications.  Follow-Up Instructions: Return in about 3 weeks (around 05/16/2018).   Ortho Exam  Patient is alert, oriented, no adenopathy, well-dressed, normal affect, normal respiratory effort. On examination patient has a healing area of skin erosion from pressure and maceration from his silicone liner.  There is a healed ruptured varicose vein of the posterior medial aspect.  There is good hair growth on his leg the residual wound has no callus or pressure ulcers distally.  Patient appears to be subsiding into his socket causing the pressure area in the popliteal fossa.  The wound is macerated approximately 4 cm in diameter with 2 small granulating wounds there is no redness no cellulitis no tenderness to palpation there is some clear drainage but no signs of  infection.  Imaging: No results found. No images are attached to the encounter.  Labs: Lab Results  Component Value Date   GRAMSTAIN Rare 03/06/2013   GRAMSTAIN WBC present-both PMN and Mononuclear 03/06/2013   GRAMSTAIN No Squamous Epithelial Cells Seen 03/06/2013   GRAMSTAIN No Organisms Seen 03/06/2013   LABORGA STAPHYLOCOCCUS AUREUS 03/06/2013     No results found for: ALBUMIN, PREALBUMIN, LABURIC  Body mass index is 29.99 kg/m.  Orders:  No orders of the defined types were placed in this encounter.  No orders of the defined types were placed in this encounter.    Procedures: No procedures performed  Clinical Data: No additional findings.  ROS:  All other systems negative, except as noted in the HPI. Review of Systems  Objective: Vital Signs: Ht  (1.803 m)   Wt 215 lb (97.5 kg)   BMI 29.99 kg/m   Specialty Comments:  No specialty comments available.  PMFS History: Patient Active Problem List   Diagnosis Date Noted  . Wound, open, knee, lower leg, or ankle with complication 01/29/2018  . Pain in left leg 01/09/2018  . Lower limb amputation, below knee 03/02/2013  . ADHD (attention deficit hyperactivity disorder) 03/02/2013   Past Medical History:  Diagnosis Date  . Acute bronchitis    mother unaware  . G tube feedings (HCC)    History of  .  H/O cardiac arrest    Septic shock age 63  . H/O gastroesophageal reflux (GERD)    mother denies  . History of ADHD   . History of kidney stones 2018   history of  . History of tracheostomy as a child   . Rash and other nonspecific skin eruption   . Septic shock due to streptococcal infection (HCC) 2007  . Ulcer of lower limb, unspecified   . Unspecified venous (peripheral) insufficiency     History reviewed. No pertinent family history.  Past Surgical History:  Procedure Laterality Date  . BELOW KNEE LEG AMPUTATION Left 2007  . JOINT REPLACEMENT Right 2010   Right hip   . LATERAL RECTUS  RESECTION Right 08/10/06 and 05/06/10   Dr. Karleen Hampshire  . WOUND EXPLORATION Left 01/31/2018   Procedure: WOUND EXPLORATION DEBRIDEMENT WOUND OF LEFT CALF;  Surgeon: Darnell Level, MD;  Location: [facility] [place];  Service: General;  Laterality: Left;   Social History   Occupational History  . Not on file  Tobacco Use  . Smoking status: Never Smoker  . Smokeless tobacco: Never Used  Substance and Sexual Activity  . Alcohol use: No  . Drug use: No  . Sexual activity: Not on file

## 2018-05-18 ENCOUNTER — Encounter (INDEPENDENT_AMBULATORY_CARE_PROVIDER_SITE_OTHER): Payer: Self-pay | Admitting: Orthopedic Surgery

## 2018-05-18 ENCOUNTER — Ambulatory Visit (INDEPENDENT_AMBULATORY_CARE_PROVIDER_SITE_OTHER): Payer: 59 | Admitting: Orthopedic Surgery

## 2018-05-18 VITALS — Ht 71.0 in | Wt 215.0 lb

## 2018-05-18 DIAGNOSIS — Z89512 Acquired absence of left leg below knee: Secondary | ICD-10-CM

## 2018-05-18 DIAGNOSIS — L97221 Non-pressure chronic ulcer of left calf limited to breakdown of skin: Secondary | ICD-10-CM | POA: Diagnosis not present

## 2018-05-18 NOTE — Progress Notes (Signed)
Office Visit Note   Patient: [name]           Date of Birth: 09/27/1996           MRN: [id number] Visit Date: 05/18/2018              Requested by: Merlyn AlbertLuking, William S, MD [address] [place], [place] [id number] PCP: Merlyn AlbertLuking, William S, MD  Chief Complaint  Patient presents with  . Left Leg - Follow-up    BKA ulcer       HPI: Patient is a 22 year old gentleman with a left transtibial amputation who has had ulcers on the residual limb.  Patient has been very his silicone liner over 12 hours today he works as a [employer].  He has a lot of sweating.  He has been using the medical black stump shrinker in the evening.  He has been using a Band-Aid and gauze over the ulcer.  He states that the maceration is improving with using the stump shrinker but does get worse during the day.  Assessment & Plan: Visit Diagnoses:  1. Calf ulcer, left, limited to breakdown of skin (HCC)   2. Hx of BKA, left (HCC)     Plan: Patient was given a medical compression stocking to cut up he will use 2 layers of the sock over the wound and will secure this with co-band.  He will change it once or twice a day as needed if it becomes damp.  Follow-Up Instructions: Return in about 1 month (around 06/15/2018).   Ortho Exam  Patient is alert, oriented, no adenopathy, well-dressed, normal affect, normal respiratory effort. Examination patient has a stable left transtibial amputation.  The ulcer has good granulation tissue this is about 10 x 30 mm and 1 mm deep.  There is maceration around the wound where the tape and damp gauze has been in contact with the skin.  There is no redness no cellulitis no odor no drainage no signs of infection.  Imaging: No results found. No images are attached to the encounter.  Labs: Lab Results  Component Value Date   GRAMSTAIN Rare 03/06/2013   GRAMSTAIN WBC present-both PMN and Mononuclear 03/06/2013   GRAMSTAIN No Squamous Epithelial Cells Seen 03/06/2013   GRAMSTAIN No Organisms Seen 03/06/2013   LABORGA STAPHYLOCOCCUS AUREUS 03/06/2013     No results found for: ALBUMIN, PREALBUMIN, LABURIC  Body mass index is 29.99 kg/m.  Orders:  No orders of the defined types were placed in this encounter.  No orders of the defined types were placed in this encounter.    Procedures: No procedures performed  Clinical Data: No additional findings.  ROS:  All other systems negative, except as noted in the HPI. Review of Systems  Objective: Vital Signs: Ht 5\' 11"  (1.803 m)   Wt 215 lb (97.5 kg)   BMI 29.99 kg/m   Specialty Comments:  No specialty comments available.  PMFS History: Patient Active Problem List   Diagnosis Date Noted  . Calf ulcer, left, limited to breakdown of skin (HCC) 05/18/2018  . Wound, open, knee, lower leg, or ankle with complication 01/29/2018  . Pain in left leg 01/09/2018  . Hx of BKA, left (HCC) 03/02/2013  . ADHD (attention deficit hyperactivity disorder) 03/02/2013   Past Medical History:  Diagnosis Date  . Acute bronchitis    mother unaware  . G tube feedings (HCC)    History of  . H/O cardiac arrest    Septic shock  age 77  . H/O gastroesophageal reflux (GERD)    mother denies  . History of ADHD   . History of kidney stones 2018   history of  . History of tracheostomy as a child   . Rash and other nonspecific skin eruption   . Septic shock due to streptococcal infection (HCC) 2007  . Ulcer of lower limb, unspecified   . Unspecified venous (peripheral) insufficiency     History reviewed. No pertinent family history.  Past Surgical History:  Procedure Laterality Date  . BELOW KNEE LEG AMPUTATION Left 2007  . JOINT REPLACEMENT Right 2010   Right hip   . LATERAL RECTUS RESECTION Right 08/10/06 and 05/06/10   Dr. Karleen Hampshire  . WOUND EXPLORATION Left 01/31/2018   Procedure: WOUND EXPLORATION DEBRIDEMENT WOUND OF LEFT CALF;  Surgeon: Darnell Level, MD;  Location: [facility] [place];   Service: General;  Laterality: Left;   Social History   Occupational History  . Not on file  Tobacco Use  . Smoking status: Never Smoker  . Smokeless tobacco: Never Used  Substance and Sexual Activity  . Alcohol use: No  . Drug use: No  . Sexual activity: Not on file

## 2018-06-20 ENCOUNTER — Ambulatory Visit (INDEPENDENT_AMBULATORY_CARE_PROVIDER_SITE_OTHER): Payer: 59 | Admitting: Orthopedic Surgery

## 2018-11-28 DIAGNOSIS — Z89511 Acquired absence of right leg below knee: Secondary | ICD-10-CM | POA: Diagnosis not present

## 2018-12-27 ENCOUNTER — Telehealth: Payer: Self-pay

## 2018-12-27 ENCOUNTER — Telehealth: Payer: Self-pay | Admitting: Family Medicine

## 2018-12-27 NOTE — Telephone Encounter (Signed)
This was addressed with [facility].

## 2018-12-27 NOTE — Telephone Encounter (Signed)
Script printed awaiting signature.  

## 2018-12-27 NOTE — Telephone Encounter (Signed)
faxed

## 2018-12-27 NOTE — Telephone Encounter (Signed)
If it is a simple rx we can do, some of the companies are now demanding utd exam and documentation, if that's the case, will ntbs. Go ahead, wis, but let mom know this

## 2018-12-27 NOTE — Telephone Encounter (Signed)
Rexford Maus is aware we have faxed the order to the requested.

## 2018-12-27 NOTE — Telephone Encounter (Signed)
I called left a message for Rexford Maus the Rn that had requested this item.

## 2018-12-27 NOTE — Telephone Encounter (Signed)
FYI-[facility] called to let you know  That they dont carry prosthetic neoprenell lines/socks.

## 2018-12-27 NOTE — Telephone Encounter (Signed)
Per [employer] Rn [phone] they are in need of a new prosthetic as well as Neoprene liners/socks. Can they get a prescription? Fax # [phone].

## 2018-12-27 NOTE — Telephone Encounter (Signed)
Per Rexford Maus she will address with [facility]. She asked that I refax. I refaxed to the same number [facility].

## 2019-02-01 DIAGNOSIS — Z89511 Acquired absence of right leg below knee: Secondary | ICD-10-CM | POA: Diagnosis not present

## 2020-01-09 ENCOUNTER — Encounter: Payer: Self-pay | Admitting: Family Medicine

## 2020-01-10 ENCOUNTER — Encounter: Payer: Self-pay | Admitting: Family Medicine

## 2020-03-20 ENCOUNTER — Ambulatory Visit (INDEPENDENT_AMBULATORY_CARE_PROVIDER_SITE_OTHER): Payer: 59 | Admitting: Family Medicine

## 2020-03-20 ENCOUNTER — Other Ambulatory Visit: Payer: Self-pay

## 2020-03-20 ENCOUNTER — Encounter: Payer: Self-pay | Admitting: Family Medicine

## 2020-03-20 VITALS — BP 138/78 | Temp 98.2°F | Wt 214.0 lb

## 2020-03-20 DIAGNOSIS — Z89512 Acquired absence of left leg below knee: Secondary | ICD-10-CM | POA: Diagnosis not present

## 2020-03-20 NOTE — Progress Notes (Addendum)
   Subjective:    Patient ID: [name], male    DOB: 07-Nov-1996, 24 y.o.   MRN: [id number]  HPI Patient comes in today for renewal of his prothesis.   Patient does not have any other concerns.  Patient has had his current prosthesis for over 5 years.  It is in need of full replacement.  There are now several cracks present in the laminated socket.  In addition, the posterior section now has jagged edges which are rubbing on the back of his leg and causing him discomfort.   Does have a history of recurring pain and discomfort with his current prosthesis   Review of Systems No headache no chest pain no shortness of breath    Objective:   Physical Exam  Alert vitals stable, NAD. Blood pressure good on repeat. HEENT normal. Lungs clear. Heart regular rate and rhythm. Stump reveals some old scarring laterally, however incision site looks good and no anterior callusing  There are some inflammatory changes posteriorly and some evident posterior callusing at the junction with the prosthesis.      Assessment & Plan:  Impression aging prosthesis, with aging and failure of materials leading to physical discomfort and physical compromise.  Observation of the prosthesis reveals numerous cracks present in the laminated socket.  In addition through wearing tear of the posterior section has developed jagged edges which are causing posterior leg discomfort.  [facility] to [facility] full [facility] of his prosthesis.  This absolutely is necessary for effective and pain-free ambulation.

## 2020-04-14 ENCOUNTER — Telehealth: Payer: Self-pay | Admitting: Family Medicine

## 2020-04-14 NOTE — Telephone Encounter (Signed)
Amended letter faxed to [facility], pt mom notified.

## 2020-04-14 NOTE — Telephone Encounter (Signed)
Mom calling checking on fax that was sent over on 4/29 to amend an office visit note for insurance to cover his new prosthetic.

## 2021-10-09 ENCOUNTER — Ambulatory Visit (INDEPENDENT_AMBULATORY_CARE_PROVIDER_SITE_OTHER): Payer: 59 | Admitting: Family Medicine

## 2021-10-09 ENCOUNTER — Other Ambulatory Visit: Payer: Self-pay

## 2021-10-09 VITALS — BP 122/76 | HR 72 | Temp 98.1°F | Ht 70.0 in | Wt 217.0 lb

## 2021-10-09 DIAGNOSIS — H66005 Acute suppurative otitis media without spontaneous rupture of ear drum, recurrent, left ear: Secondary | ICD-10-CM | POA: Diagnosis not present

## 2021-10-09 MED ORDER — AMOXICILLIN-POT CLAVULANATE 875-125 MG PO TABS: 1.0000 | ORAL_TABLET | Freq: Two times a day (BID) | ORAL | 0 refills | Status: DC

## 2021-10-09 NOTE — Progress Notes (Signed)
Subjective:  Patient ID: [name], male    DOB: 1996/05/06  Age: 25 y.o. MRN: [id number]  CC: Chief Complaint  Patient presents with   Otalgia    Left ear drainage for a few weeks Sounds like water in his ear No other symptoms    HPI:  25 year old male presents for evaluation of the above.  Patient reports that for the past 2 to 3 weeks he has had left ear discharge and associated discomfort.  He denies any other respiratory symptoms.  He has a history of tympanostomy tubes.  No medications or inventions tried.  No relieving factors.  No other complaints at this time.  Patient Active Problem List   Diagnosis Date Noted   Recurrent acute suppurative otitis media without spontaneous rupture of left tympanic membrane 10/09/2021   Hx of BKA, left (HCC) 03/02/2013   ADHD (attention deficit hyperactivity disorder) 03/02/2013    Social Hx   Social History   Socioeconomic History   Marital status: Single    Spouse name: Not on file   Number of children: Not on file   Years of education: Not on file   Highest education level: Not on file  Occupational History   Not on file  Tobacco Use   Smoking status: Never   Smokeless tobacco: Never  Vaping Use   Vaping Use: Never used  Substance and Sexual Activity   Alcohol use: No   Drug use: No   Sexual activity: Not on file  Other Topics Concern   Not on file  Social History Narrative   Not on file   Social Determinants of Health   Financial Resource Strain: Not on file  Food Insecurity: Not on file  Transportation Needs: Not on file  Physical Activity: Not on file  Stress: Not on file  Social Connections: Not on file    Review of Systems  Constitutional:  Negative for fever.  HENT:  Positive for ear discharge and ear pain.     Objective:  BP 122/76   Pulse 72   Temp 98.1 F (36.7 C) (Oral)   Ht 5\' 10"  (1.778 m)   Wt 217 lb (98.4 kg)   SpO2 99%   BMI 31.14 kg/m   BP/Weight 10/09/2021 03/20/2020  05/18/2018  Systolic BP 122 138 -  Diastolic BP 76 78 -  Wt. (Lbs) 217 214 215  BMI 31.14 29.85 29.99    Physical Exam Vitals and nursing note reviewed.  Constitutional:      General: He is not in acute distress.    Appearance: Normal appearance. He is not ill-appearing.  HENT:     Head: Normocephalic and atraumatic.     Ears:     Comments: Left TM is dull with effusion and erythema.  Tympanostomy tube in place. Eyes:     General:        Right eye: No discharge.        Left eye: No discharge.     Conjunctiva/sclera: Conjunctivae normal.  Pulmonary:     Effort: Pulmonary effort is normal. No respiratory distress.  Neurological:     Mental Status: He is alert.  Psychiatric:        Mood and Affect: Mood normal.        Behavior: Behavior normal.    Lab Results  Component Value Date   HGB 14.7 01/31/2018     Assessment & Plan:   Problem List Items Addressed This Visit  Nervous and Auditory   Recurrent acute suppurative otitis media without spontaneous rupture of left tympanic membrane - Primary    Treating with Augmentin.  Patient has a tympanostomy tube that is still in place from when he was child.  Referring to ENT.      Relevant Medications   amoxicillin-clavulanate (AUGMENTIN) 875-125 MG tablet    Meds ordered this encounter  Medications   amoxicillin-clavulanate (AUGMENTIN) 875-125 MG tablet    Sig: Take 1 tablet by mouth 2 (two) times daily.    Dispense:  20 tablet    Refill:  0    Follow-up:  Return in about 1 year (around 10/09/2022) for Annual physical.  Everlene Other DO [facility]

## 2021-10-09 NOTE — Assessment & Plan Note (Signed)
Treating with Augmentin.  Patient has a tympanostomy tube that is still in place from when he was child.  Referring to ENT.

## 2021-10-09 NOTE — Patient Instructions (Signed)
Antibiotic as prescribed.  I am placing a referral to ENT.  Follow up annually.  Take care  Dr. Adriana Simas

## 2022-08-01 ENCOUNTER — Emergency Department (HOSPITAL_COMMUNITY): Payer: 59

## 2022-08-01 ENCOUNTER — Encounter (HOSPITAL_COMMUNITY): Payer: Self-pay | Admitting: *Deleted

## 2022-08-01 ENCOUNTER — Other Ambulatory Visit: Payer: Self-pay

## 2022-08-01 ENCOUNTER — Emergency Department (HOSPITAL_COMMUNITY)
Admission: EM | Admit: 2022-08-01 | Discharge: 2022-08-01 | Disposition: A | Discharge status: Home / Self Care | Payer: 59 | Attending: Emergency Medicine | Admitting: Emergency Medicine

## 2022-08-01 DIAGNOSIS — Y92828 Other wilderness area as the place of occurrence of the external cause: Secondary | ICD-10-CM | POA: Insufficient documentation

## 2022-08-01 DIAGNOSIS — S42021A Displaced fracture of shaft of right clavicle, initial encounter for closed fracture: Secondary | ICD-10-CM | POA: Diagnosis not present

## 2022-08-01 DIAGNOSIS — Z87442 Personal history of urinary calculi: Secondary | ICD-10-CM | POA: Diagnosis not present

## 2022-08-01 DIAGNOSIS — S4991XA Unspecified injury of right shoulder and upper arm, initial encounter: Secondary | ICD-10-CM | POA: Diagnosis present
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
## 2022-08-03 ENCOUNTER — Encounter: Payer: Self-pay | Admitting: Surgery

## 2022-08-03 ENCOUNTER — Ambulatory Visit (INDEPENDENT_AMBULATORY_CARE_PROVIDER_SITE_OTHER): Payer: 59 | Admitting: Surgery

## 2022-08-03 VITALS — BP 142/81 | HR 73

## 2022-08-03 DIAGNOSIS — S42021A Displaced fracture of shaft of right clavicle, initial encounter for closed fracture: Secondary | ICD-10-CM

## 2022-08-03 NOTE — Progress Notes (Signed)
Office Visit Note   Patient: [name]           Date of Birth: 21-Jun-1996           MRN: [id number] Visit Date: 08/03/2022              Requested by: [name], DO [address] [place],  [place] [address] PCP: [name], DO   Assessment & Plan: Visit Diagnoses:  1. Closed displaced fracture of shaft of right clavicle, initial encounter   Preop visit  Plan: I discussed x-ray findings with patient today.  Advised him that the best treatment option for his fracture would be open reduction internal fixation procedure.  Surgical procedure discussed along with potential operative time and recovery period.  Shoulder immobilizer until at least 6 weeks postop.  I did review x-rays with Dr. Ophelia Charter today.  All questions answered.  Follow-Up Instructions: Return for Needs return office visit with Dr. Ophelia Charter 1 week postop.   Orders:  No orders of the defined types were placed in this encounter.  No orders of the defined types were placed in this encounter.     Procedures: No procedures performed   Clinical Data: No additional findings.   Subjective: Chief Complaint  Patient presents with   Right Shoulder - Pain    HPI  Review of Systems  Constitutional:  Positive for activity change.  HENT: Negative.    Respiratory: Negative.    Cardiovascular: Negative.   Gastrointestinal: Negative.   Genitourinary: Negative.   Neurological: Negative.      Objective: Vital Signs: BP (!) 142/81   Pulse 73   Physical Exam Constitutional:      Appearance: Normal appearance.  HENT:     Head: Normocephalic and atraumatic.     Nose: Nose normal.  Eyes:     Extraocular Movements: Extraocular movements intact.  Cardiovascular:     Rate and Rhythm: Normal rate and regular rhythm.     Heart sounds: No murmur heard. Pulmonary:     Effort: Pulmonary effort is normal. No respiratory distress.     Breath sounds: Normal breath sounds.  Musculoskeletal:     Comments:  Patient is marked tenderness over the clavicle fracture site.  Shoulder immobilizer on.  Radial pulse intact.  Moves fingers well.  Neurological:     Mental Status: He is alert and oriented to person, place, and time.     Ortho Exam  Specialty Comments:  No specialty comments available.  Imaging: No results found.   PMFS History: Patient Active Problem List   Diagnosis Date Noted   Recurrent acute suppurative otitis media without spontaneous rupture of left tympanic membrane 10/09/2021   Hx of BKA, left (HCC) 03/02/2013   ADHD (attention deficit hyperactivity disorder) 03/02/2013   Past Medical History:  Diagnosis Date   Acute bronchitis    mother unaware   G tube feedings (HCC)    History of   H/O cardiac arrest    Septic shock age 72   H/O gastroesophageal reflux (GERD)    mother denies   History of ADHD    History of kidney stones 2018   history of   History of tracheostomy as a child    Rash and other nonspecific skin eruption    Septic shock due to streptococcal infection (HCC) 2007   Ulcer of lower limb, unspecified    Unspecified venous (peripheral) insufficiency     History reviewed. No pertinent family history.  Past Surgical History:  Procedure Laterality Date   BELOW KNEE LEG AMPUTATION Left 2007   JOINT REPLACEMENT Right 2010   Right hip    LATERAL RECTUS RESECTION Right 08/10/06 and 05/06/10   Dr. Karleen Hampshire   WOUND EXPLORATION Left 01/31/2018   Procedure: WOUND EXPLORATION DEBRIDEMENT WOUND OF LEFT CALF;  Surgeon: Darnell Level, MD;  Location: [facility];  Service: General;  Laterality: Left;   Social History   Occupational History   Not on file  Tobacco Use   Smoking status: Never   Smokeless tobacco: Never  Vaping Use   Vaping Use: Never used  Substance and Sexual Activity   Alcohol use: No   Drug use: No   Sexual activity: Not on file

## 2022-08-06 NOTE — Telephone Encounter (Signed)
I called patient with details.

## 2022-08-09 ENCOUNTER — Encounter (HOSPITAL_COMMUNITY): Payer: Self-pay | Admitting: Orthopaedic Surgery

## 2022-08-09 ENCOUNTER — Other Ambulatory Visit: Payer: Self-pay

## 2022-08-09 NOTE — Progress Notes (Signed)
PCP - [name], DO  ERAS Protcol - Clears until 1145  Anesthesia review: N  Patient verbally denies any shortness of breath, fever, cough and chest pain during phone call   -------------  SDW INSTRUCTIONS given:  Your procedure is scheduled on 08/11/22.  Report to [facility] Main Entrance "A" at 1215 P.M., and check in at the Admitting office.  Call this number if you have problems the morning of surgery:  [phone]   Remember:  Do not eat after midnight the night before your surgery  You may drink clear liquids until 1145 the morning of your surgery.   Clear liquids allowed are: Water, Non-Citrus Juices (without pulp), Carbonated Beverages, Clear Tea, Black Coffee Only, and Gatorade    Take these medicines the morning of surgery with A SIP OF WATER  Tylenol  As of today, STOP taking any Aspirin (unless otherwise instructed by your surgeon) Aleve, Naproxen, Ibuprofen, Motrin, Advil, Goody's, BC's, all herbal medications, fish oil, and all vitamins.                      Do not wear jewelry, make up, or nail polish            Do not wear lotions, powders, perfumes/colognes, or deodorant.            Do not shave 48 hours prior to surgery.  Men may shave face and neck.            Do not bring valuables to the hospital.            [facility] is not responsible for any belongings or valuables.  Do NOT Smoke (Tobacco/Vaping) 24 hours prior to your procedure If you use a CPAP at night, you may bring all equipment for your overnight stay.   Contacts, glasses, dentures or bridgework may not be worn into surgery.      For patients admitted to the hospital, discharge time will be determined by your treatment team.   Patients discharged the day of surgery will not be allowed to drive home, and someone needs to stay with them for 24 hours.    Special instructions:   [facility]- Preparing For Surgery  Before surgery, you can play an important role. Because skin is not  sterile, your skin needs to be as free of germs as possible. You can reduce the number of germs on your skin by washing with CHG (chlorahexidine gluconate) Soap before surgery.  CHG is an antiseptic cleaner which kills germs and bonds with the skin to continue killing germs even after washing.    Oral Hygiene is also important to reduce your risk of infection.  Remember - BRUSH YOUR TEETH THE MORNING OF SURGERY WITH YOUR REGULAR TOOTHPASTE  Please do not use if you have an allergy to CHG or antibacterial soaps. If your skin becomes reddened/irritated stop using the CHG.  Do not shave (including legs and underarms) for at least 48 hours prior to first CHG shower. It is OK to shave your face.  Please follow these instructions carefully.   Shower the NIGHT BEFORE SURGERY and the MORNING OF SURGERY with DIAL Soap.   Pat yourself dry with a CLEAN TOWEL.  Wear CLEAN PAJAMAS to bed the night before surgery  Place CLEAN SHEETS on your bed the night of your first shower and DO NOT SLEEP WITH PETS.   Day of Surgery: Please shower morning of surgery  Wear Clean/Comfortable clothing the  morning of surgery Do not apply any deodorants/lotions.   Remember to brush your teeth WITH YOUR REGULAR TOOTHPASTE.   Questions were answered. Patient verbalized understanding of instructions.

## 2022-08-10 NOTE — H&P (Signed)
Office Visit Note              Patient: [name]                                       Date of Birth: 1996-11-22                                                      MRN: [id number] Visit Date: 08/03/2022                                                                     Requested by: [name], DO [address] [place],  [place] [address] PCP: [name], DO     Assessment & Plan: Visit Diagnoses:  1. Closed displaced fracture of shaft of right clavicle, initial encounter   Preop visit   Plan: I discussed x-ray findings with patient today.  Advised him that the best treatment option for his fracture would be open reduction internal fixation procedure.  Surgical procedure discussed along with potential operative time and recovery period.  Shoulder immobilizer until at least 6 weeks postop.  I did review x-rays with Dr. Ophelia Charter today.  All questions answered.   Follow-Up Instructions: Return for Needs return office visit with Dr. Ophelia Charter 1 week postop.    Orders:  No orders of the defined types were placed in this encounter.   No orders of the defined types were placed in this encounter.        Procedures: No procedures performed     Clinical Data: No additional findings.     Subjective:    Chief Complaint  Patient presents with   Right Shoulder - Pain      HPI   Review of Systems  Constitutional:  Positive for activity change.  HENT: Negative.    Respiratory: Negative.    Cardiovascular: Negative.   Gastrointestinal: Negative.   Genitourinary: Negative.   Neurological: Negative.         Objective: Vital Signs: BP (!) 142/81   Pulse 73    Physical Exam Constitutional:      Appearance: Normal appearance.  HENT:     Head: Normocephalic and atraumatic.     Nose: Nose normal.  Eyes:     Extraocular Movements: Extraocular movements intact.  Cardiovascular:     Rate and Rhythm: Normal rate and regular rhythm.     Heart sounds: No murmur  heard. Pulmonary:     Effort: Pulmonary effort is normal. No respiratory distress.     Breath sounds: Normal breath sounds.  Musculoskeletal:     Comments: Patient is marked tenderness over the clavicle fracture site.  Shoulder immobilizer on.  Radial pulse intact.  Moves fingers well.  Neurological:     Mental Status: He is alert and oriented to person, place, and time.        Ortho Exam   Specialty Comments:  No specialty comments available.   Imaging: No results found.  PMFS History:     Patient Active Problem List    Diagnosis Date Noted   Recurrent acute suppurative otitis media without spontaneous rupture of left tympanic membrane 10/09/2021   Hx of BKA, left (HCC) 03/02/2013   ADHD (attention deficit hyperactivity disorder) 03/02/2013        Past Medical History:  Diagnosis Date   Acute bronchitis      mother unaware   G tube feedings (HCC)      History of   H/O cardiac arrest      Septic shock age 74   H/O gastroesophageal reflux (GERD)      mother denies   History of ADHD     History of kidney stones 2018    history of   History of tracheostomy as a child     Rash and other nonspecific skin eruption     Septic shock due to streptococcal infection (HCC) 2007   Ulcer of lower limb, unspecified     Unspecified venous (peripheral) insufficiency      History reviewed. No pertinent family history.       Past Surgical History:  Procedure Laterality Date   BELOW KNEE LEG AMPUTATION Left 2007   JOINT REPLACEMENT Right 2010    Right hip    LATERAL RECTUS RESECTION Right 08/10/06 and 05/06/10    Dr. Karleen Hampshire   WOUND EXPLORATION Left 01/31/2018    Procedure: WOUND EXPLORATION DEBRIDEMENT WOUND OF LEFT CALF;  Surgeon: Darnell Level, MD;  Location: [facility];  Service: General;  Laterality: Left;    Social History        Occupational History   Not on file  Tobacco Use   Smoking status: Never   Smokeless tobacco: Never  Vaping Use   Vaping  Use: Never used  Substance and Sexual Activity   Alcohol use: No   Drug use: No   Sexual activity: Not on file                  Instructions   Return for Needs return office visit with Dr. Ophelia Charter 1 week postop.

## 2022-08-10 NOTE — Telephone Encounter (Signed)
noted 

## 2022-08-11 ENCOUNTER — Encounter (HOSPITAL_COMMUNITY): Payer: Self-pay | Admitting: Orthopaedic Surgery

## 2022-08-11 ENCOUNTER — Ambulatory Visit (HOSPITAL_BASED_OUTPATIENT_CLINIC_OR_DEPARTMENT_OTHER): Payer: 59 | Admitting: Anesthesiology

## 2022-08-11 ENCOUNTER — Ambulatory Visit (HOSPITAL_COMMUNITY): Payer: 59

## 2022-08-11 ENCOUNTER — Other Ambulatory Visit: Payer: Self-pay

## 2022-08-11 ENCOUNTER — Observation Stay (HOSPITAL_COMMUNITY)
Admission: RE | Admit: 2022-08-11 | Discharge: 2022-08-12 | Disposition: A | Discharge status: Home / Self Care | Payer: 59 | Source: Ambulatory Visit | Attending: Orthopaedic Surgery | Admitting: Orthopaedic Surgery

## 2022-08-11 ENCOUNTER — Ambulatory Visit (HOSPITAL_COMMUNITY): Payer: 59 | Admitting: Anesthesiology

## 2022-08-11 ENCOUNTER — Encounter (HOSPITAL_COMMUNITY)
Admission: RE | Disposition: A | Discharge status: Home / Self Care | Payer: Self-pay | Source: Ambulatory Visit | Attending: Orthopaedic Surgery

## 2022-08-11 DIAGNOSIS — S42021A Displaced fracture of shaft of right clavicle, initial encounter for closed fracture: Principal | ICD-10-CM

## 2022-08-11 DIAGNOSIS — X58XXXA Exposure to other specified factors, initial encounter: Secondary | ICD-10-CM | POA: Insufficient documentation

## 2022-08-11 DIAGNOSIS — Z01818 Encounter for other preprocedural examination: Secondary | ICD-10-CM

## 2022-08-11 DIAGNOSIS — Z8616 Personal history of COVID-19: Secondary | ICD-10-CM | POA: Diagnosis not present

## 2022-08-11 DIAGNOSIS — S42001A Fracture of unspecified part of right clavicle, initial encounter for closed fracture: Secondary | ICD-10-CM | POA: Diagnosis not present

## 2022-08-11 DIAGNOSIS — Z96641 Presence of right artificial hip joint: Secondary | ICD-10-CM | POA: Insufficient documentation

## 2022-08-11 DIAGNOSIS — S42009A Fracture of unspecified part of unspecified clavicle, initial encounter for closed fracture: Secondary | ICD-10-CM | POA: Diagnosis present

## 2022-08-11 HISTORY — DX: COVID-19: U07.1

## 2022-08-11 HISTORY — PX: ORIF CLAVICULAR FRACTURE: SHX5055

## 2022-08-11 LAB — SURGICAL PCR SCREEN
MRSA, PCR: NEGATIVE
Staphylococcus aureus: POSITIVE — AB

## 2022-08-11 SURGERY — OPEN REDUCTION INTERNAL FIXATION (ORIF) CLAVICULAR FRACTURE
Anesthesia: General | Site: Shoulder | Laterality: Right

## 2022-08-11 MED ORDER — FENTANYL CITRATE (PF) 100 MCG/2ML IJ SOLN
INTRAMUSCULAR | Status: DC | PRN
  Administered 2022-08-11: 50 ug via INTRAVENOUS
  Administered 2022-08-11: 100 ug via INTRAVENOUS

## 2022-08-11 MED ORDER — METHOCARBAMOL 500 MG PO TABS
500.0000 mg | ORAL_TABLET | Freq: Four times a day (QID) | ORAL | Status: DC | PRN
  Administered 2022-08-11: 500 mg via ORAL

## 2022-08-11 MED ORDER — MIDAZOLAM HCL 2 MG/2ML IJ SOLN
INTRAMUSCULAR | Status: AC
  Filled 2022-08-11: qty 2

## 2022-08-11 MED ORDER — OXYCODONE HCL 5 MG PO TABS: 5.0000 mg | ORAL_TABLET | Freq: Once | ORAL | Status: DC | PRN

## 2022-08-11 MED ORDER — LIDOCAINE 2% (20 MG/ML) 5 ML SYRINGE
INTRAMUSCULAR | Status: DC | PRN
  Administered 2022-08-11: 60 mg via INTRAVENOUS

## 2022-08-11 MED ORDER — CELECOXIB 200 MG PO CAPS
200.0000 mg | ORAL_CAPSULE | Freq: Once | ORAL | Status: DC
  Filled 2022-08-11: qty 1

## 2022-08-11 MED ORDER — SUGAMMADEX SODIUM 200 MG/2ML IV SOLN
INTRAVENOUS | Status: DC | PRN
  Administered 2022-08-11: 200 mg via INTRAVENOUS

## 2022-08-11 MED ORDER — METOCLOPRAMIDE HCL 5 MG PO TABS: 5.0000 mg | ORAL_TABLET | Freq: Three times a day (TID) | ORAL | Status: DC | PRN

## 2022-08-11 MED ORDER — 0.9 % SODIUM CHLORIDE (POUR BTL) OPTIME
TOPICAL | Status: DC | PRN
  Administered 2022-08-11: 1000 mL

## 2022-08-11 MED ORDER — METHOCARBAMOL 500 MG PO TABS
ORAL_TABLET | ORAL | Status: AC
  Filled 2022-08-11: qty 1

## 2022-08-11 MED ORDER — CEFAZOLIN SODIUM-DEXTROSE 2-4 GM/100ML-% IV SOLN
2.0000 g | INTRAVENOUS | Status: AC
  Administered 2022-08-11: 2 g via INTRAVENOUS
  Filled 2022-08-11: qty 100

## 2022-08-11 MED ORDER — BUPIVACAINE HCL (PF) 0.25 % IJ SOLN
INTRAMUSCULAR | Status: DC | PRN
  Administered 2022-08-11: 10 mL

## 2022-08-11 MED ORDER — DOCUSATE SODIUM 100 MG PO CAPS: 100.0000 mg | ORAL_CAPSULE | Freq: Two times a day (BID) | ORAL | Status: DC

## 2022-08-11 MED ORDER — OXYCODONE-ACETAMINOPHEN 5-325 MG PO TABS: 1.0000 | ORAL_TABLET | Freq: Four times a day (QID) | ORAL | 0 refills | Status: AC | PRN

## 2022-08-11 MED ORDER — CHLORHEXIDINE GLUCONATE 0.12 % MT SOLN
15.0000 mL | OROMUCOSAL | Status: AC
  Administered 2022-08-11: 15 mL via OROMUCOSAL
  Filled 2022-08-11 (×2): qty 15

## 2022-08-11 MED ORDER — MENTHOL 3 MG MT LOZG: 1.0000 | LOZENGE | OROMUCOSAL | Status: DC | PRN

## 2022-08-11 MED ORDER — METOCLOPRAMIDE HCL 5 MG/ML IJ SOLN: 5.0000 mg | Freq: Three times a day (TID) | INTRAMUSCULAR | Status: DC | PRN

## 2022-08-11 MED ORDER — POLYETHYLENE GLYCOL 3350 17 G PO PACK: 17.0000 g | PACK | Freq: Every day | ORAL | Status: DC | PRN

## 2022-08-11 MED ORDER — FENTANYL CITRATE (PF) 250 MCG/5ML IJ SOLN
INTRAMUSCULAR | Status: AC
  Filled 2022-08-11: qty 5

## 2022-08-11 MED ORDER — BUPIVACAINE HCL (PF) 0.25 % IJ SOLN
INTRAMUSCULAR | Status: AC
  Filled 2022-08-11: qty 30

## 2022-08-11 MED ORDER — PROPOFOL 10 MG/ML IV BOLUS
INTRAVENOUS | Status: AC
  Filled 2022-08-11: qty 20

## 2022-08-11 MED ORDER — PHENOL 1.4 % MT LIQD: 1.0000 | OROMUCOSAL | Status: DC | PRN

## 2022-08-11 MED ORDER — OXYCODONE HCL 5 MG PO TABS: 5.0000 mg | ORAL_TABLET | ORAL | Status: DC | PRN

## 2022-08-11 MED ORDER — DEXAMETHASONE SODIUM PHOSPHATE 10 MG/ML IJ SOLN
INTRAMUSCULAR | Status: DC | PRN
  Administered 2022-08-11: 5 mg via INTRAVENOUS

## 2022-08-11 MED ORDER — METHOCARBAMOL 1000 MG/10ML IJ SOLN: 500.0000 mg | Freq: Four times a day (QID) | INTRAVENOUS | Status: DC | PRN

## 2022-08-11 MED ORDER — ACETAMINOPHEN 500 MG PO TABS
1000.0000 mg | ORAL_TABLET | Freq: Once | ORAL | Status: DC
  Filled 2022-08-11: qty 2

## 2022-08-11 MED ORDER — ONDANSETRON HCL 4 MG/2ML IJ SOLN: 4.0000 mg | Freq: Four times a day (QID) | INTRAMUSCULAR | Status: DC | PRN

## 2022-08-11 MED ORDER — ACETAMINOPHEN 325 MG PO TABS: 325.0000 mg | ORAL_TABLET | Freq: Four times a day (QID) | ORAL | Status: DC | PRN

## 2022-08-11 MED ORDER — OXYCODONE HCL 5 MG/5ML PO SOLN: 5.0000 mg | Freq: Once | ORAL | Status: DC | PRN

## 2022-08-11 MED ORDER — PHENYLEPHRINE 80 MCG/ML (10ML) SYRINGE FOR IV PUSH (FOR BLOOD PRESSURE SUPPORT)
PREFILLED_SYRINGE | INTRAVENOUS | Status: AC
  Filled 2022-08-11: qty 10

## 2022-08-11 MED ORDER — ROCURONIUM BROMIDE 10 MG/ML (PF) SYRINGE
PREFILLED_SYRINGE | INTRAVENOUS | Status: AC
  Filled 2022-08-11: qty 10

## 2022-08-11 MED ORDER — FENTANYL CITRATE (PF) 100 MCG/2ML IJ SOLN
25.0000 ug | INTRAMUSCULAR | Status: DC | PRN
  Administered 2022-08-11: 50 ug via INTRAVENOUS

## 2022-08-11 MED ORDER — ONDANSETRON HCL 4 MG/2ML IJ SOLN
INTRAMUSCULAR | Status: AC
Start: 2022-08-11 — End: ?
  Filled 2022-08-11: qty 2

## 2022-08-11 MED ORDER — ROCURONIUM BROMIDE 10 MG/ML (PF) SYRINGE
PREFILLED_SYRINGE | INTRAVENOUS | Status: DC | PRN
  Administered 2022-08-11: 20 mg via INTRAVENOUS
  Administered 2022-08-11: 60 mg via INTRAVENOUS
  Administered 2022-08-11: 20 mg via INTRAVENOUS

## 2022-08-11 MED ORDER — LIDOCAINE 2% (20 MG/ML) 5 ML SYRINGE
INTRAMUSCULAR | Status: AC
  Filled 2022-08-11: qty 5

## 2022-08-11 MED ORDER — PROMETHAZINE HCL 25 MG/ML IJ SOLN: 6.2500 mg | INTRAMUSCULAR | Status: DC | PRN

## 2022-08-11 MED ORDER — MIDAZOLAM HCL 2 MG/2ML IJ SOLN
INTRAMUSCULAR | Status: DC | PRN
  Administered 2022-08-11: 2 mg via INTRAVENOUS

## 2022-08-11 MED ORDER — ONDANSETRON HCL 4 MG PO TABS: 4.0000 mg | ORAL_TABLET | Freq: Four times a day (QID) | ORAL | Status: DC | PRN

## 2022-08-11 MED ORDER — METHOCARBAMOL 500 MG PO TABS: 500.0000 mg | ORAL_TABLET | Freq: Four times a day (QID) | ORAL | 0 refills | Status: AC | PRN

## 2022-08-11 MED ORDER — PROPOFOL 10 MG/ML IV BOLUS
INTRAVENOUS | Status: DC | PRN
  Administered 2022-08-11: 200 mg via INTRAVENOUS

## 2022-08-11 MED ORDER — LACTATED RINGERS IV SOLN: INTRAVENOUS | Status: DC

## 2022-08-11 MED ORDER — DEXAMETHASONE SODIUM PHOSPHATE 10 MG/ML IJ SOLN
INTRAMUSCULAR | Status: AC
  Filled 2022-08-11: qty 1

## 2022-08-11 MED ORDER — HYDROMORPHONE HCL 1 MG/ML IJ SOLN: 0.5000 mg | INTRAMUSCULAR | Status: DC | PRN

## 2022-08-11 MED ORDER — FENTANYL CITRATE (PF) 100 MCG/2ML IJ SOLN
INTRAMUSCULAR | Status: AC
  Administered 2022-08-11: 25 ug via INTRAVENOUS
  Filled 2022-08-11: qty 2

## 2022-08-11 MED ORDER — SODIUM CHLORIDE 0.9 % IV SOLN: INTRAVENOUS | Status: DC

## 2022-08-11 SURGICAL SUPPLY — 51 items
BAG COUNTER SPONGE SURGICOUNT (BAG) ×2 IMPLANT
BAG SPNG CNTER NS LX DISP (BAG) ×1
BIT DRILL CLAV ALPS 2.7X145 (BIT) IMPLANT
COVER SURGICAL LIGHT HANDLE (MISCELLANEOUS) ×2 IMPLANT
DRAPE C-ARM 42X72 X-RAY (DRAPES) ×2 IMPLANT
DRAPE SURG 17X23 STRL (DRAPES) ×2 IMPLANT
DRAPE U-SHAPE 47X51 STRL (DRAPES) ×2 IMPLANT
DRSG MEPILEX BORDER 4X12 (GAUZE/BANDAGES/DRESSINGS) IMPLANT
ELECT REM PT RETURN 9FT ADLT (ELECTROSURGICAL) ×1
ELECTRODE REM PT RTRN 9FT ADLT (ELECTROSURGICAL) ×2 IMPLANT
GAUZE SPONGE 4X4 12PLY STRL (GAUZE/BANDAGES/DRESSINGS) ×2 IMPLANT
GAUZE XEROFORM 1X8 LF (GAUZE/BANDAGES/DRESSINGS) ×2 IMPLANT
GLOVE BIOGEL PI IND STRL 7.0 (GLOVE) IMPLANT
GLOVE BIOGEL PI IND STRL 8 (GLOVE) ×4 IMPLANT
GLOVE BIOGEL PI INDICATOR 7.0 (GLOVE) ×3
GLOVE BIOGEL PI INDICATOR 8 (GLOVE) ×2
GLOVE ORTHO TXT STRL SZ7.5 (GLOVE) ×4 IMPLANT
GOWN STRL REUS W/ TWL LRG LVL3 (GOWN DISPOSABLE) ×2 IMPLANT
GOWN STRL REUS W/ TWL XL LVL3 (GOWN DISPOSABLE) ×2 IMPLANT
GOWN STRL REUS W/TWL 2XL LVL3 (GOWN DISPOSABLE) ×2 IMPLANT
GOWN STRL REUS W/TWL LRG LVL3 (GOWN DISPOSABLE) ×2
GOWN STRL REUS W/TWL XL LVL3 (GOWN DISPOSABLE) ×1
KIT BASIN OR (CUSTOM PROCEDURE TRAY) ×2 IMPLANT
KIT TURNOVER KIT B (KITS) ×2 IMPLANT
MANIFOLD NEPTUNE II (INSTRUMENTS) ×2 IMPLANT
NDL HYPO 25GX1X1/2 BEV (NEEDLE) IMPLANT
NEEDLE HYPO 25GX1X1/2 BEV (NEEDLE) IMPLANT
NS IRRIG 1000ML POUR BTL (IV SOLUTION) ×2 IMPLANT
PACK SHOULDER (CUSTOM PROCEDURE TRAY) ×2 IMPLANT
PAD ARMBOARD 7.5X6 YLW CONV (MISCELLANEOUS) ×4 IMPLANT
PLATE CLAV ANT 140MM 3H (Plate) IMPLANT
SCREW CORT LP 3.5X14 (Screw) IMPLANT
SCREW LOCK CORT STAR 3.5X14 (Screw) IMPLANT
SCREW LOCK CORT STAR 3.5X16 (Screw) IMPLANT
SCREW LP NL T15 3.5X22 (Screw) IMPLANT
SCREW LP NL T15 3.5X24 (Screw) IMPLANT
SCREW T15 MD 3.5X18 (Screw) IMPLANT
SCREW TIS LP 3.5X18 NS (Screw) IMPLANT
SLING ARM IMMOBILIZER XL (CAST SUPPLIES) IMPLANT
SPONGE T-LAP 18X18 ~~LOC~~+RFID (SPONGE) ×2 IMPLANT
STRIP CLOSURE SKIN 1/2X4 (GAUZE/BANDAGES/DRESSINGS) ×2 IMPLANT
SUCTION FRAZIER HANDLE 10FR (MISCELLANEOUS) ×1
SUCTION TUBE FRAZIER 10FR DISP (MISCELLANEOUS) ×2 IMPLANT
SUT PROLENE 3 0 PS 1 (SUTURE) ×2 IMPLANT
SUT VIC AB 0 CT1 27 (SUTURE) ×2
SUT VIC AB 0 CT1 27XBRD ANBCTR (SUTURE) IMPLANT
SUT VIC AB 2-0 CT1 27 (SUTURE) ×1
SUT VIC AB 2-0 CT1 TAPERPNT 27 (SUTURE) ×2 IMPLANT
SYR CONTROL 10ML LL (SYRINGE) IMPLANT
WATER STERILE IRR 1000ML POUR (IV SOLUTION) ×2 IMPLANT
YANKAUER SUCT BULB TIP NO VENT (SUCTIONS) ×2 IMPLANT

## 2022-08-11 NOTE — Interval H&P Note (Signed)
History and Physical Interval Note:  08/11/2022 2:16 PM  [name]  has presented today for surgery, with the diagnosis of right clavicle fracture.  The various methods of treatment have been discussed with the patient and family. After consideration of risks, benefits and other options for treatment, the patient has consented to  Procedure(s): OPEN REDUCTION INTERNAL FIXATION (ORIF) RIGHT CLAVICLE FRACTURE (Right) as a surgical intervention.  The patient's history has been reviewed, patient examined, no change in status, stable for surgery.  I have reviewed the patient's chart and labs.  Questions were answered to the patient's satisfaction.     Eldred Manges

## 2022-08-11 NOTE — Anesthesia Preprocedure Evaluation (Addendum)
Anesthesia Evaluation  Patient identified by MRN, date of birth, ID band Patient awake    Reviewed: Allergy & Precautions, NPO status , Patient's Chart, lab work & pertinent test results  History of Anesthesia Complications Negative for: history of anesthetic complications  Airway Mallampati: II  TM Distance: >3 FB Neck ROM: Full    Dental  (+) Dental Advisory Given   Pulmonary   Hx tracheostomy as a child    Pulmonary exam normal        Cardiovascular negative cardio ROS Normal cardiovascular exam     Neuro/Psych negative neurological ROS  negative psych ROS   GI/Hepatic negative GI ROS, Neg liver ROS,   Endo/Other   Obesity   Renal/GU negative Renal ROS     Musculoskeletal negative musculoskeletal ROS (+)   Abdominal   Peds  (+) ADHD Hematology negative hematology ROS (+)   Anesthesia Other Findings Severe sepsis 16 years ago, prolonged hospitalization including cardiac arrest, tracheostomy (decannulated after ~4 mo), left BKA  Reproductive/Obstetrics                            Anesthesia Physical Anesthesia Plan  ASA: 2  Anesthesia Plan: General   Post-op Pain Management: Tylenol PO (pre-op)* and Celebrex PO (pre-op)*   Induction: Intravenous  PONV Risk Score and Plan: 2 and Treatment may vary due to age or medical condition, Ondansetron, Dexamethasone and Midazolam  Airway Management Planned: Oral ETT  Additional Equipment: None  Intra-op Plan:   Post-operative Plan: Extubation in OR  Informed Consent: I have reviewed the patients History and Physical, chart, labs and discussed the procedure including the risks, benefits and alternatives for the proposed anesthesia with the patient or authorized representative who has indicated his/her understanding and acceptance.     Dental advisory given  Plan Discussed with: CRNA and Anesthesiologist  Anesthesia Plan  Comments:        Anesthesia Quick Evaluation

## 2022-08-11 NOTE — Op Note (Signed)
Pre and postop diagnosis: Right displaced comminuted clavicle shaft fracture.  Procedure: Open reduction internal fixation right comminuted closed clavicle fracture.  Surgeon: Annell Greening, MD  Assistant: Zonia Kief, PA-C medically necessary and present for the entire procedure.  Required for maintenance of reduction of the comminuted fracture with multiple fragments to maintain position during plate fixation.  Anesthesia: General orotracheal  EBL less than 100 cc  Implants:Implants  PLATE CLAV ANT 662HU 3H - [id number]  Inventory Item: PLATE CLAV ANT 656CL 3H Serial no.:  Model/Cat no.: [id number]  Implant name: PLATE CLAV ANT 494WH 3H - [id number] Laterality: Right Area: Clavicle  Manufacturer: [employer](ORTH,TRAU,BIO,SG) Date of Manufacture:    Action: Implanted Number Used: 1   Device Identifier:  Device Identifier Type:     SCREW LP NL T15 3.5X24 - [id number]  Inventory Item: SCREW LP NL T15 3.5X24 Serial no.:  Model/Cat no.: [id number]  Implant name: SCREW LP NL T15 3.5X24 - [id number] Laterality: Right Area: Clavicle  Manufacturer: [employer](ORTH,TRAU,BIO,SG) Date of Manufacture:    Action: Implanted Number Used: 2   Device Identifier:  Device Identifier Type:     SCREW CORT LP 3.5X14 - [id number]  Inventory Item: SCREW CORT LP 3.5X14 Serial no.:  Model/Cat no.: [id number]  Implant name: SCREW CORT LP 3.5X14 - [id number] Laterality: Right Area: Clavicle  Manufacturer: [employer](ORTH,TRAU,BIO,SG) Date of Manufacture:    Action: Implanted Number Used: 1   Device Identifier:  Device Identifier Type:     SCREW TIS LP 3.5X18 NS - [id number]  Inventory Item: SCREW TIS LP 3.5X18 NS Serial no.:  Model/Cat no.: [id number]  Implant name: SCREW TIS LP 3.5X18 NS - [id number] Laterality: Right Area: Clavicle  Manufacturer: [employer](ORTH,TRAU,BIO,SG) Date of Manufacture:    Action: Implanted Number Used: 3   Device Identifier:  Device Identifier Type:     SCREW LP NL  T15 3.5X22 - [id number]  Inventory Item: SCREW LP NL T15 3.5X22 Serial no.:  Model/Cat no.: [id number]  Implant name: SCREW LP NL T15 3.5X22 - [id number] Laterality: Right Area: Clavicle  Manufacturer: [employer](ORTH,TRAU,BIO,SG) Date of Manufacture:    Action: Implanted Number Used: 1   Device Identifier:  Device Identifier Type:     SCREW LOCK CORT STAR 3.5X16 - [id number]  Inventory Item: SCREW LOCK CORT STAR 3.5X16 Serial no.:  Model/Cat no.: [id number]  Implant name: SCREW LOCK CORT STAR 3.5X16 - [id number] Laterality: Right Area: Clavicle  Manufacturer: [employer](ORTH,TRAU,BIO,SG) Date of Manufacture:    Action: Implanted Number Used: 1   Device Identifier:  Device Identifier Type:     SCREW LOCK CORT STAR 3.5X14 - [id number]  Inventory Item: SCREW LOCK CORT STAR 3.5X14 Serial no.:  Model/Cat no.: [id number]  Implant name: SCREW LOCK CORT STAR 3.5X14 - [id number] Laterality: Right Area: Clavicle  Manufacturer: [employer](ORTH,TRAU,BIO,SG) Date of Manufacture:    Action: Implanted Number Used: 2   Device Identifier:  Device Identifier Type:     SCREW T15 MD 3.5X18 - [id number]  Inventory Item: SCREW T15 MD 3.5X18 Serial no.:  Model/Cat no.: [id number]  Implant name: SCREW T15 MD 3.5X18 - [id number] Laterality: Right Area: Clavicle  Manufacturer: [employer](ORTH,TRAU,BIO,SG) Date of Manufacture:    Action: Implanted Number Used: 1   Device Identifier:  Device Identifier Type:    Procedure: After induction general anesthesia Ancef prophylaxis intubation beachchair position with securement 1015 drape was passed underneath the axilla and a 1010 drape placed up over the side of the head ending at the neck.  Neck shoulder region  and upper extremity to the wrist was prepped with DuraPrep the usual impervious stockinette extremity sheets and drapes were applied.  Split sheets were used sterile skin marker and Betadine Steri-Drape sealing the skin.  After timeout procedure Ancef  prophylaxis incision was made as shaped over the normal course of the clavicle and the clavicle was then applied proximal and distal and followed to the midportion where there was comminuted fracture with 3 prominent pieces present with comminution of a distance of 3 cm.  With some difficulty using anterior plate made by [employer] reduction was performed and held with multiple lobster-claw clamps.  Fixation distally was performed and then after reduction adequately medialmost screw was placed holding at length and then securement.  Custom bending of the plate was required in the usual fashion.  Couple locking screws were used but primarily interfrag screws.  1 screw hole at the level of the fracture was left open.  There were at least 4-5 screws on each side of the fracture site.  1 screw was lagged to catch the posterior piece from the prominent medial piece securing it with 1 nonlocking screw.  Fluoroscopy was brought in check screw position length.  Plate extended within 2 mm of the acromioclavicular joint.  All screws look good on fluoroscopy.  Copious irrigation and reapproximation of the muscle and periosteum overlying the clavicle with Vicryl sutures.  Total in the subcutaneous tissue and then skin staple closure postop dressing and sling was applied.  Patient tolerated procedure well Marcaine was infiltrated in the skin for postoperative analgesia and patient will stay overnight.

## 2022-08-11 NOTE — Transfer of Care (Signed)
Immediate Anesthesia Transfer of Care Note  Patient: [name]  Procedure(s) Performed: OPEN REDUCTION INTERNAL FIXATION (ORIF) RIGHT CLAVICLE FRACTURE (Right: Shoulder)  Patient Location: PACU  Anesthesia Type:General  Level of Consciousness: drowsy and patient cooperative  Airway & Oxygen Therapy: Patient Spontanous Breathing  Post-op Assessment: Report given to RN  Post vital signs: Reviewed and stable  Last Vitals:  Vitals Value Taken Time  BP 119/56 08/11/22 1650  Temp    Pulse 94 08/11/22 1652  Resp 14 08/11/22 1652  SpO2 95 % 08/11/22 1652  Vitals shown include unvalidated device data.  Last Pain:  Vitals:   08/11/22 1313  PainSc: 0-No pain      Patients Stated Pain Goal: 0 (08/11/22 1313)  Complications: No notable events documented.

## 2022-08-11 NOTE — Anesthesia Procedure Notes (Signed)
Procedure Name: Intubation Date/Time: 08/11/2022 3:04 PM  Performed by: Barrington Ellison, CRNAPre-anesthesia Checklist: Patient identified, Emergency Drugs available, Suction available and Patient being monitored Patient Re-evaluated:Patient Re-evaluated prior to induction Oxygen Delivery Method: Circle System Utilized Preoxygenation: Pre-oxygenation with 100% oxygen Induction Type: IV induction Ventilation: Mask ventilation without difficulty Laryngoscope Size: Mac and 4 Grade View: Grade I Tube type: Oral Tube size: 7.0 mm Number of attempts: 1 Airway Equipment and Method: Stylet and Oral airway Placement Confirmation: ETT inserted through vocal cords under direct vision, positive ETCO2 and breath sounds checked- equal and bilateral Secured at: 22 cm Tube secured with: Tape Dental Injury: Teeth and Oropharynx as per pre-operative assessment

## 2022-08-12 ENCOUNTER — Encounter (HOSPITAL_COMMUNITY): Payer: Self-pay | Admitting: Orthopaedic Surgery

## 2022-08-12 ENCOUNTER — Other Ambulatory Visit: Payer: Self-pay

## 2022-08-12 NOTE — Progress Notes (Signed)
Patient ID: [name], male   DOB: 03/08/1996, 26 y.o.   MRN: [id number]   Subjective: 1 Day Post-Op Procedure(s) (LRB): OPEN REDUCTION INTERNAL FIXATION (ORIF) RIGHT CLAVICLE FRACTURE (Right) Patient reports pain as mild and moderate.    Objective: Vital signs in last 24 hours: Temp:  [97.6 F (36.4 C)-98.2 F (36.8 C)] 97.8 F (36.6 C) (08/31 0501) Pulse Rate:  [67-95] 95 (08/31 0501) Resp:  [13-20] 17 (08/31 0501) BP: (101-133)/(56-85) 120/57 (08/31 0501) SpO2:  [91 %-99 %] 99 % (08/31 0501) Weight:  [97.5 kg] 97.5 kg (08/30 1313)  Intake/Output from previous day: 08/30 0701 - 08/31 0700 In: -  Out: 10 [Blood:10] Intake/Output this shift: No intake/output data recorded.  No results for input(s): "HGB" in the last 72 hours. No results for input(s): "WBC", "RBC", "HCT", "PLT" in the last 72 hours. No results for input(s): "NA", "K", "CL", "CO2", "BUN", "CREATININE", "GLUCOSE", "CALCIUM" in the last 72 hours. No results for input(s): "LABPT", "INR" in the last 72 hours.  Neurologically intact DG Clavicle Right  Result Date: 08/11/2022 CLINICAL DATA:  Provided history: Open reduction internal fixation (ORIF) right clavicle fracture. Provided fluoroscopy time 4.1 seconds (0.29 mGy). EXAM: RIGHT CLAVICLE - 2+ VIEWS COMPARISON:  Radiographs of the right clavicle 08/01/2022. FINDINGS: Two intraoperative fluoroscopic images of the right clavicle are submitted. On the provided images, there are findings of interval plate and screw fixation of the right clavicle. Correlate with the operative history. IMPRESSION: Two intraoperative fluoroscopic images from ORIF of the right clavicle. Correlate with the operative history. Electronically Signed   By: Jackey Loge D.O.   On: 08/11/2022 16:30   DG C-Arm 1-60 Min-No Report  Result Date: 08/11/2022 Fluoroscopy was utilized by the requesting physician.  No radiographic interpretation.    Assessment/Plan: 1 Day Post-Op Procedure(s)  (LRB): OPEN REDUCTION INTERNAL FIXATION (ORIF) RIGHT CLAVICLE FRACTURE (Right) Plan: discharge home. Has THA, BKA , and now ORIF right clavicle. Office one week. Rx sent to his pharmacy for post op pain  Eldred Manges 08/12/2022, 8:09 AM

## 2022-08-12 NOTE — Plan of Care (Signed)

## 2022-08-12 NOTE — Anesthesia Postprocedure Evaluation (Signed)
Anesthesia Post Note  Patient: Isayah Ignasiak Somarriba  Procedure(s) Performed: OPEN REDUCTION INTERNAL FIXATION (ORIF) RIGHT CLAVICLE FRACTURE (Right: Shoulder)     Patient location during evaluation: PACU Anesthesia Type: General Level of consciousness: awake and alert Pain management: pain level controlled Vital Signs Assessment: post-procedure vital signs reviewed and stable Respiratory status: spontaneous breathing, nonlabored ventilation and respiratory function stable Cardiovascular status: stable and blood pressure returned to baseline Anesthetic complications: no   No notable events documented.  Last Vitals:  Vitals:   08/12/22 0250 08/12/22 0501  BP: 128/68 (!) 120/57  Pulse: 86 95  Resp: 16 17  Temp: 36.7 C 36.6 C  SpO2: 97% 99%    Last Pain:  Vitals:   08/12/22 0825  TempSrc:   PainSc: 0-No pain                 Beryle Lathe

## 2022-08-12 NOTE — Discharge Instructions (Signed)
Okay to shower with dressing on.  If your dressing comes off use extra dressings applied to change it.  See Dr. Ophelia Charter in 1 week.  Pain medication has been sent to your pharmacy.  Take it as needed for pain.  You can use plain Tylenol if you would like instead.

## 2022-08-12 NOTE — Progress Notes (Signed)
RN gave patient discharge instructions, pt mother at bedside and stated understanding IV has been removed.
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
## 2022-08-19 ENCOUNTER — Ambulatory Visit (INDEPENDENT_AMBULATORY_CARE_PROVIDER_SITE_OTHER): Payer: 59 | Admitting: Surgery

## 2022-08-19 ENCOUNTER — Encounter: Payer: Self-pay | Admitting: Surgery

## 2022-08-19 VITALS — BP 129/59 | HR 67 | Ht 70.0 in | Wt 215.0 lb

## 2022-08-19 DIAGNOSIS — Z8781 Personal history of (healed) traumatic fracture: Secondary | ICD-10-CM

## 2022-08-19 DIAGNOSIS — Z9889 Other specified postprocedural states: Secondary | ICD-10-CM

## 2022-08-19 NOTE — Progress Notes (Signed)
Office Visit Note   Patient: [name]           Date of Birth: 08-26-1996           MRN: [id number] Visit Date: 08/19/2022              Requested by: [name], DO [address] [place],  [place] [address] PCP: [name], DO   Assessment & Plan: Visit Diagnoses: No diagnosis found.  Plan: Patient will follow-up with Dr. Ophelia Charter in 1 week for wound check and possible staple removal.  Advised him to wear his sling until next office visit.  Again patient and his mother states that Dr. Ophelia Charter had previously advised that he can discontinue the sling and we did discuss the potential risk of getting a nonunion or having hardware failure.  Told patient that he can expect to be out of work until at least 6 weeks postop and he cannot drive until at least 6 weeks postop.  Dr. Ophelia Charter can discuss these things with him further next week and make changes if he feels appropriate  Follow-Up Instructions: Return in about 5 days (around 08/24/2022) for with dr yates for wound check, discuss RTW status.   Orders:  No orders of the defined types were placed in this encounter.  No orders of the defined types were placed in this encounter.     Procedures: No procedures performed   Clinical Data: No additional findings.   Subjective: Chief Complaint  Patient presents with   Right Shoulder - Routine Post Op    HPI 26 year old male who is 1 week status post ORIF right clavicle fracture returns.  Patient not wearing his sling and states that Dr. Ophelia Charter advised him when he discharged from the hospital that he did not need to use it.  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   Objective: Vital Signs: BP (!) 129/59   Pulse 67   Ht 5\' 10"  (1.778 m)   Wt 215 lb (97.5 kg)   BMI 30.85 kg/m   Physical Exam Wound looks good.  Staples intact.  No drainage or signs infection. Ortho Exam  Specialty Comments:  No specialty  comments available.  Imaging: No results found.   PMFS History: Patient Active Problem List   Diagnosis Date Noted   Clavicle fracture 08/11/2022   Closed displaced fracture of shaft of right clavicle    Recurrent acute suppurative otitis media without spontaneous rupture of left tympanic membrane 10/09/2021   Hx of BKA, left (HCC) 03/02/2013   ADHD (attention deficit hyperactivity disorder) 03/02/2013   Past Medical History:  Diagnosis Date   Acute bronchitis    mother unaware   COVID-19    12/2021   G tube feedings (HCC)    History of   H/O cardiac arrest    Septic shock age 60   H/O gastroesophageal reflux (GERD)    mother denies   History of ADHD    History of kidney stones 2018   history of   History of tracheostomy as a child    Rash and other nonspecific skin eruption    Septic shock due to streptococcal infection (HCC) 2007   Ulcer of lower limb, unspecified    Unspecified venous (peripheral) insufficiency     History reviewed. No pertinent family history.  Past Surgical History:  Procedure Laterality Date   BELOW KNEE LEG AMPUTATION  Left 2007   JOINT REPLACEMENT Right 2010   Right hip    LATERAL RECTUS RESECTION Right 08/10/06 and 05/06/10   Dr. Karleen Hampshire   ORIF CLAVICULAR FRACTURE Right 08/11/2022   Procedure: OPEN REDUCTION INTERNAL FIXATION (ORIF) RIGHT CLAVICLE FRACTURE;  Surgeon: Eldred Manges, MD;  Location: [facility];  Service: Orthopedics;  Laterality: Right;   WOUND EXPLORATION Left 01/31/2018   Procedure: WOUND EXPLORATION DEBRIDEMENT WOUND OF LEFT CALF;  Surgeon: Darnell Level, MD;  Location: [facility];  Service: General;  Laterality: Left;   Social History   Occupational History   Not on file  Tobacco Use   Smoking status: Never   Smokeless tobacco: Never  Vaping Use   Vaping Use: Never used  Substance and Sexual Activity   Alcohol use: No   Drug use: No   Sexual activity: Not on file

## 2022-08-24 ENCOUNTER — Encounter: Payer: Self-pay | Admitting: Orthopaedic Surgery

## 2022-08-24 ENCOUNTER — Ambulatory Visit (INDEPENDENT_AMBULATORY_CARE_PROVIDER_SITE_OTHER): Payer: 59 | Admitting: Orthopaedic Surgery

## 2022-08-24 ENCOUNTER — Ambulatory Visit (INDEPENDENT_AMBULATORY_CARE_PROVIDER_SITE_OTHER): Payer: 59

## 2022-08-24 VITALS — BP 120/83 | HR 71 | Ht 70.0 in | Wt 215.0 lb

## 2022-08-24 DIAGNOSIS — Z8781 Personal history of (healed) traumatic fracture: Secondary | ICD-10-CM

## 2022-08-24 DIAGNOSIS — Z9889 Other specified postprocedural states: Secondary | ICD-10-CM | POA: Diagnosis not present

## 2022-08-24 NOTE — Progress Notes (Unsigned)
Office Visit Note   Patient: [name]           Date of Birth: 1996/09/19           MRN: [id number] Visit Date: 08/24/2022              Requested by: [name], DO [address] [place],  [place] [address] PCP: [name], DO   Assessment & Plan: Visit Diagnoses:  1. S/P ORIF (open reduction internal fixation) fracture     Plan: Postop ORIF clavicle.  Staples are harvested Steri-Strips applied.  X-rays show good position of the plate and comminuted fragments.  The lateral aspect of the clavicle appears to be riding high consistent with an AC separation sometime in the past.  Acromioclavicular joint on 08/01/2022 images appeared satisfactory position.  He does not have any tenderness at acromioclavicular joint and exposure and plate stops short of the joint which was palpated with a needle for confirmation where the joint was avoid stripping off the capsule.  Patient stopped using the sling.  We discussed the importance of being easy with his arm with the comminuted fracture to avoid failure of fixation or nonunion.  Recheck 3 weeks.  Repeat x-ray on return.  Follow-Up Instructions: Return in about 3 weeks (around 09/14/2022).   Orders:  Orders Placed This Encounter  Procedures   XR Clavicle Right   No orders of the defined types were placed in this encounter.     Procedures: No procedures performed   Clinical Data: No additional findings.   Subjective: Chief Complaint  Patient presents with   Right Shoulder - Follow-up, Routine Post Op    08/11/2022 ORIF right clavicle fracture    HPI postop follow-up ORIF clavicle  Review of Systems unchanged   Objective: Vital Signs: BP 120/83   Pulse 71   Ht 5\' 10"  (1.778 m)   Wt 215 lb (97.5 kg)   BMI 30.85 kg/m   Physical Exam unchanged  Ortho Exam slight numbness inferior to the clavicle incision.  Staples are intact and are ready for harvest.  No cellulitis.  Sensation hand is intact.  Specialty  Comments:  No specialty comments available.  Imaging: No results found.   PMFS History: Patient Active Problem List   Diagnosis Date Noted   Clavicle fracture 08/11/2022   Closed displaced fracture of shaft of right clavicle    Recurrent acute suppurative otitis media without spontaneous rupture of left tympanic membrane 10/09/2021   Hx of BKA, left (HCC) 03/02/2013   ADHD (attention deficit hyperactivity disorder) 03/02/2013   Past Medical History:  Diagnosis Date   Acute bronchitis    mother unaware   COVID-19    12/2021   G tube feedings (HCC)    History of   H/O cardiac arrest    Septic shock age 26   H/O gastroesophageal reflux (GERD)    mother denies   History of ADHD    History of kidney stones 2018   history of   History of tracheostomy as a child    Rash and other nonspecific skin eruption    Septic shock due to streptococcal infection (HCC) 2007   Ulcer of lower limb, unspecified    Unspecified venous (peripheral) insufficiency     No family history on file.  Past Surgical History:  Procedure Laterality Date   BELOW KNEE LEG AMPUTATION Left 2007   JOINT REPLACEMENT Right 2010   Right hip    LATERAL  RECTUS RESECTION Right 08/10/06 and 05/06/10   Dr. Karleen Hampshire   ORIF CLAVICULAR FRACTURE Right 08/11/2022   Procedure: OPEN REDUCTION INTERNAL FIXATION (ORIF) RIGHT CLAVICLE FRACTURE;  Surgeon: Eldred Manges, MD;  Location: [facility];  Service: Orthopedics;  Laterality: Right;   WOUND EXPLORATION Left 01/31/2018   Procedure: WOUND EXPLORATION DEBRIDEMENT WOUND OF LEFT CALF;  Surgeon: Darnell Level, MD;  Location: [facility];  Service: General;  Laterality: Left;   Social History   Occupational History   Not on file  Tobacco Use   Smoking status: Never   Smokeless tobacco: Never  Vaping Use   Vaping Use: Never used  Substance and Sexual Activity   Alcohol use: No   Drug use: No   Sexual activity: Not on file

## 2022-09-14 ENCOUNTER — Ambulatory Visit (INDEPENDENT_AMBULATORY_CARE_PROVIDER_SITE_OTHER): Payer: 59 | Admitting: Orthopaedic Surgery

## 2022-09-14 ENCOUNTER — Ambulatory Visit (INDEPENDENT_AMBULATORY_CARE_PROVIDER_SITE_OTHER): Payer: 59

## 2022-09-14 ENCOUNTER — Encounter: Payer: Self-pay | Admitting: Orthopaedic Surgery

## 2022-09-14 VITALS — BP 138/90 | Ht 70.0 in | Wt 215.0 lb

## 2022-09-14 DIAGNOSIS — Z8781 Personal history of (healed) traumatic fracture: Secondary | ICD-10-CM

## 2022-09-14 DIAGNOSIS — S42021D Displaced fracture of shaft of right clavicle, subsequent encounter for fracture with routine healing: Secondary | ICD-10-CM

## 2022-09-14 DIAGNOSIS — Z9889 Other specified postprocedural states: Secondary | ICD-10-CM
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
## 2024-06-05 ENCOUNTER — Ambulatory Visit: Admitting: Family Medicine

## 2024-06-05 ENCOUNTER — Encounter: Payer: Self-pay | Admitting: Family Medicine

## 2024-06-05 VITALS — BP 102/65 | HR 57 | Temp 98.1°F | Ht 70.0 in | Wt 180.0 lb

## 2024-06-05 DIAGNOSIS — Z89512 Acquired absence of left leg below knee: Secondary | ICD-10-CM

## 2024-06-05 NOTE — Patient Instructions (Signed)
 I will send over to [facility].  Take care  Dr. Bluford

## 2024-06-05 NOTE — Assessment & Plan Note (Signed)
 Given significant weight loss, he needs a new prosthesis to ensure good fit and to allow him to perform optimally. Additionally, he needs new silicone locking liners.  He needs 2 of these.  He also needs 6 multi ply socks.

## 2024-06-05 NOTE — Progress Notes (Signed)
 Subjective:  Patient ID: [name], male    DOB: 11/13/1996  Age: 28 y.o. MRN: [id number]  CC:   Chief Complaint  Patient presents with   Referral    Referral for prosthetic fitting   New Patient (Initial Visit)    Reestablish care    HPI:  28 year old male presents today for the above.   Patient is status post left BKA.  He has lost a significant amount of weight recently.  This has resulted in a poor fitting prosthesis.  He has had a wound which is currently healed.  However, this was directly due to the fact that his prosthesis was not fitting properly in the setting of weight loss.  He needs a new prosthesis due to the weight loss so that it fits properly.  Also needs new silicone locking liners and multi ply socks.  He gets his supplies through [facility].  This is medically necessary to give him a well-fitting prosthesis so that he can ambulate well and to prevent the development of wounds/infections.  Patient Active Problem List   Diagnosis Date Noted   Hx of BKA, left (HCC) 03/02/2013    Social Hx   Social History   Socioeconomic History   Marital status: Single    Spouse name: Not on file   Number of children: Not on file   Years of education: Not on file   Highest education level: Not on file  Occupational History   Not on file  Tobacco Use   Smoking status: Never   Smokeless tobacco: Never  Vaping Use   Vaping status: Never Used  Substance and Sexual Activity   Alcohol use: No   Drug use: No   Sexual activity: Not on file  Other Topics Concern   Not on file  Social History Narrative   Not on file   Social Drivers of Health   Financial Resource Strain: Not on file  Food Insecurity: Not on file  Transportation Needs: Not on file  Physical Activity: Not on file  Stress: Not on file  Social Connections: Not on file    Review of Systems Per HPI  Objective:  BP 102/65   Pulse (!) 57   Temp 98.1 F (36.7 C)   Ht 5' 10 (1.778 m)   Wt  180 lb (81.6 kg) Comment: with prostethic  SpO2 98%   BMI 25.83 kg/m      06/05/2024    1:07 PM 09/14/2022    3:27 PM 08/24/2022    3:21 PM  BP/Weight  Systolic BP 102 138 120  Diastolic BP 65 90 83  Wt. (Lbs) 180 215 215  BMI 25.83 kg/m2 30.85 kg/m2 30.85 kg/m2    Physical Exam Constitutional:      General: He is not in acute distress.    Appearance: Normal appearance.  HENT:     Head: Normocephalic and atraumatic.   Cardiovascular:     Rate and Rhythm: Normal rate and regular rhythm.  Pulmonary:     Effort: Pulmonary effort is normal.     Breath sounds: Normal breath sounds.   Skin:    Comments: Patient has a healed wound to the stump of his left BKA.  Still has a small area of erythema.   Neurological:     Mental Status: He is alert.   Psychiatric:        Mood and Affect: Mood normal.        Behavior: Behavior normal.  Lab Results  Component Value Date   HGB 14.7 01/31/2018     Assessment & Plan:  Hx of BKA, left (HCC) Assessment & Plan: Given significant weight loss, he needs a new prosthesis to ensure good fit and to allow him to perform optimally. Additionally, he needs new silicone locking liners.  He needs 2 of these.  He also needs 6 multi ply socks.    Follow-up:  Return if symptoms worsen or fail to improve.  Jacqulyn Ahle DO [facility]

## 2024-08-30 DIAGNOSIS — Z89512 Acquired absence of left leg below knee: Secondary | ICD-10-CM | POA: Diagnosis not present
